# Patient Record
Sex: Male | Born: 1955 | Race: White | Hispanic: No | Smoking: Current every day smoker
Health system: Southern US, Community
[De-identification: ages and names within clinical notes are randomized; demographics above are authoritative.]

## PROBLEM LIST (undated history)

## (undated) DIAGNOSIS — E8881 Metabolic syndrome: Secondary | ICD-10-CM

## (undated) DIAGNOSIS — K219 Gastro-esophageal reflux disease without esophagitis: Secondary | ICD-10-CM

## (undated) DIAGNOSIS — F419 Anxiety disorder, unspecified: Secondary | ICD-10-CM

## (undated) DIAGNOSIS — F32A Depression, unspecified: Secondary | ICD-10-CM

## (undated) DIAGNOSIS — E119 Type 2 diabetes mellitus without complications: Secondary | ICD-10-CM

## (undated) DIAGNOSIS — F329 Major depressive disorder, single episode, unspecified: Secondary | ICD-10-CM

## (undated) DIAGNOSIS — I10 Essential (primary) hypertension: Secondary | ICD-10-CM

## (undated) HISTORY — DX: Depression, unspecified: F32.A

## (undated) HISTORY — DX: Type 2 diabetes mellitus without complications: E11.9

## (undated) HISTORY — DX: Essential (primary) hypertension: I10

## (undated) HISTORY — PX: TYMPANOPLASTY: SHX33

## (undated) HISTORY — DX: Anxiety disorder, unspecified: F41.9

## (undated) HISTORY — PX: TONSILLECTOMY: SUR1361

## (undated) HISTORY — DX: Gastro-esophageal reflux disease without esophagitis: K21.9

## (undated) HISTORY — DX: Major depressive disorder, single episode, unspecified: F32.9

## (undated) HISTORY — DX: Metabolic syndrome: E88.81

---

## 2005-01-29 DIAGNOSIS — E119 Type 2 diabetes mellitus without complications: Secondary | ICD-10-CM

## 2005-01-29 DIAGNOSIS — I10 Essential (primary) hypertension: Secondary | ICD-10-CM

## 2005-01-29 HISTORY — DX: Essential (primary) hypertension: I10

## 2005-01-29 HISTORY — DX: Type 2 diabetes mellitus without complications: E11.9

## 2015-11-29 DIAGNOSIS — Z139 Encounter for screening, unspecified: Secondary | ICD-10-CM

## 2015-11-29 NOTE — Congregational Nurse Program (Signed)
Congregational Nurse Program Note  Date of Encounter: 11/29/2015  Past Medical History: No past medical history on file.  Encounter Details:     CNP Questionnaire - 11/29/15 1445      Patient Demographics   Is this a new or existing patient? New   Patient is considered a/an Not Applicable   Race African-American/Black     Patient Assistance   Location of Patient [place]   Patient's financial/insurance status Low Income;Self-Pay   Uninsured Patient Yes   Interventions Counseled to make appt. with provider;Assisted patient in making appt.;Referred to ED/Urgent Care   Patient referred to apply for the following financial assistance Not Applicable   Food insecurities addressed Not Applicable   Transportation assistance No   Assistance securing medications No   Educational health offerings Chronic disease;Diabetes;Navigating the healthcare system;Nutrition     Encounter Details   Primary purpose of visit Chronic Illness/Condition Visit;Navigating the Healthcare System;Education/Health Concerns   Was an Emergency Department visit averted? No  referred client to Emergency room at [facility], client declined   Does patient have a medical provider? No   Patient referred to Area Agency;Clinic;Establish PCP;Emergency Department   Was a mental health screening completed? (GAINS tool) No   Does patient have dental issues? No   Does patient have vision issues? No   Does your patient have an abnormal blood pressure today? Yes   Since previous encounter, have you referred patient for abnormal blood pressure that resulted in a new diagnosis or medication change? No   Does your patient have an abnormal blood glucose today? Yes   Since previous encounter, have you referred patient for abnormal blood glucose that resulted in a new diagnosis or medication change? No   Was there a life-saving intervention made? No     New client to [employer]. Client currently living with  mother who is disabled and is primary caregiver.  Client states it has been years since he has seen a primary care provider and years since he has taken any medication for his diabetes. Client has no income and no health insurance. Vitals: 154/94 normal cuff, sitting, pulse 120( client states he is very nervous), non-fasting random fingerstick glucose 418 client ate oatmeal and coffee at 1:00 PM today. He states he too Metformin years ago 1000 mg twice daily.  Past Surgical history: Ear drum replacement at age 7 .  Medical History: Diabetes type 2, Hypertension, Depression?.  Allergies: Morphine= Nausea Complains today of needing a referral to a medical provider for management of his diabetes , hypertension and general medical care. He also is complaining of some abdominal pain and constipation. He states he eats fresh fruits and vegetables and drinks plenty of water. He states he has tried stool softeners , but they have not helped a lot. Bowel sounds present, abdomen soft. Client states a question to past history of possible IBS. He is also complaining of pain in both knees, along with acid reflux.  RN counseled client on the risks of his uncontrolled blood sugars and with the result of his capillary glucose today of 418, Danger of damage to his eyes, kidneys , heart and circulation , along with risk of stroke and heart attack. Recommended that client go to the Emergency room today for treatment and management of his blood sugar. Client states understanding of the risks of uncontrolled blood sugars and declines going to the emergency room today, due to having no one to care for his mother or  to check in on her. He states he still wants a primary care referral. Options for medical care discussed along with mental health care for depression and stress. Client chooses to be referred to the [facility] and appointment secured for 12/01/15 at 1:15 pm. Client wishes to wait for referral for  mental health services. He denies thoughts or plans of suicide or of harming others. He states no past suicidal attempts.  RN will follow up with client [facility] . Discussed with client that we currently have social work interns available on Tuesdays and Thursday that can further assess his needs and assist in mental health referrals. Client agreed to think about it and we will discuss it further on the follow up call from the RN.  RN again recommended that client go to the Emergency Room at [facility] today for treatment of his elevated blood sugar. Client states he will not go today due to need to care for his mother who is disabled. RN encouraged client to call 911 immediately for any changes in vision, difficulty with speech, facial drooping or weakness in arms or legs , any increase or change in abdominal discomfort or any other changes . Client understands and states that he will do so. Will follow up with client post PCP appointment.

## 2015-12-01 ENCOUNTER — Encounter: Payer: Self-pay | Admitting: Physician Assistant

## 2015-12-01 ENCOUNTER — Ambulatory Visit: Payer: Self-pay | Admitting: Physician Assistant

## 2015-12-01 ENCOUNTER — Telehealth: Payer: Self-pay

## 2015-12-01 VITALS — BP 146/76 | HR 111 | Temp 98.1°F | Ht 70.75 in | Wt 191.0 lb

## 2015-12-01 DIAGNOSIS — I10 Essential (primary) hypertension: Secondary | ICD-10-CM

## 2015-12-01 DIAGNOSIS — IMO0001 Reserved for inherently not codable concepts without codable children: Secondary | ICD-10-CM

## 2015-12-01 DIAGNOSIS — R062 Wheezing: Secondary | ICD-10-CM

## 2015-12-01 DIAGNOSIS — F419 Anxiety disorder, unspecified: Secondary | ICD-10-CM

## 2015-12-01 DIAGNOSIS — F17218 Nicotine dependence, cigarettes, with other nicotine-induced disorders: Secondary | ICD-10-CM

## 2015-12-01 DIAGNOSIS — E785 Hyperlipidemia, unspecified: Secondary | ICD-10-CM

## 2015-12-01 DIAGNOSIS — E1165 Type 2 diabetes mellitus with hyperglycemia: Principal | ICD-10-CM

## 2015-12-01 DIAGNOSIS — Z125 Encounter for screening for malignant neoplasm of prostate: Secondary | ICD-10-CM

## 2015-12-01 DIAGNOSIS — Z1211 Encounter for screening for malignant neoplasm of colon: Secondary | ICD-10-CM

## 2015-12-01 DIAGNOSIS — E118 Type 2 diabetes mellitus with unspecified complications: Secondary | ICD-10-CM

## 2015-12-01 LAB — GLUCOSE, POCT (MANUAL RESULT ENTRY): POC GLUCOSE: 331 mg/dL — AB (ref 70–99)

## 2015-12-01 MED ORDER — LISINOPRIL 10 MG PO TABS: 10.0000 mg | ORAL_TABLET | Freq: Every day | ORAL | 1 refills | Status: DC

## 2015-12-01 MED ORDER — METFORMIN HCL 1000 MG PO TABS: 1000.0000 mg | ORAL_TABLET | Freq: Two times a day (BID) | ORAL | 1 refills | Status: DC

## 2015-12-01 NOTE — Patient Instructions (Signed)
Get lab/blood draw Return stool test Go to Diabetes education class Go to [facility] for depression/anxiety

## 2015-12-01 NOTE — Telephone Encounter (Signed)
Client called RN to follow up after his first new patient visit to the [facility]. Client states that his appointmehnt went well and that he feels much better and less stressed. He states they will get him lab work and back on medication for his diabetes and others as needed. RN discussed with client about connecting him to other services such as mental health services for depression. Client states that he is "okay" now that he is in to see a provider. RN encouraged client to call if we can assist with any other needs. Will follow as needed.

## 2015-12-01 NOTE — Progress Notes (Signed)
BP (!) 146/76 (BP Location: Left Arm, Patient Position: Sitting, Cuff Size: Normal)   Pulse (!) 111   Temp 98.1 F (36.7 C)   Ht 5' 10.75" (1.797 m)   Wt 191 lb (86.6 kg)   SpO2 98%   BMI 26.83 kg/m    Subjective:    Patient ID: Jerry Cuevas, male    DOB: [birth year], 60 y.o.   MRN: [id number]  HPI: Jerry Cuevas is a 60 y.o. male presenting on 12/01/2015 for New Patient (Initial Visit) (pt states he hasn't had a provider in about 5 years. pt states provider was in [place]. pt moved to [facility] about 3 months ago to care for his mother)   HPI  Chief Complaint  Patient presents with  . New Patient (Initial Visit)    pt states he hasn't had a provider in about 5 years. pt states provider was in [place]. pt moved to [facility] about 3 months ago to care for his mother     In the past, pt was on metformin, glymiperide and amitrypylline and lisinopril and hctz  Pt already given in formation on walk-in for [facility]. He hasn't been yet    Relevant past medical, surgical, family and social history reviewed and updated as indicated. Interim medical history since our last visit reviewed. Allergies and medications reviewed and updated.  CURRENT MEDS: none  Review of Systems  Constitutional: Positive for fatigue. Negative for appetite change, chills, diaphoresis, fever and unexpected weight change.  HENT: Positive for dental problem. Negative for congestion, drooling, ear pain, facial swelling, hearing loss, mouth sores, sneezing, sore throat, trouble swallowing and voice change.   Eyes: Negative for pain, discharge, redness, itching and visual disturbance.  Respiratory: Negative for cough, choking, shortness of breath and wheezing.   Cardiovascular: Negative for chest pain, palpitations and leg swelling.  Gastrointestinal: Positive for constipation. Negative for abdominal pain, blood in stool, diarrhea and vomiting.  Endocrine: Negative for cold intolerance,  heat intolerance and polydipsia.  Genitourinary: Negative for decreased urine volume, dysuria and hematuria.  Musculoskeletal: Positive for arthralgias and back pain. Negative for gait problem.  Skin: Negative for rash.  Allergic/Immunologic: Negative for environmental allergies.  Neurological: Negative for seizures, syncope, light-headedness and headaches.  Hematological: Negative for adenopathy.  Psychiatric/Behavioral: Positive for dysphoric mood. Negative for agitation and suicidal ideas. The patient is not nervous/anxious.     Per HPI unless specifically indicated above     Objective:    BP (!) 146/76 (BP Location: Left Arm, Patient Position: Sitting, Cuff Size: Normal)   Pulse (!) 111   Temp 98.1 F (36.7 C)   Ht 5' 10.75" (1.797 m)   Wt 191 lb (86.6 kg)   SpO2 98%   BMI 26.83 kg/m   Wt Readings from Last 3 Encounters:  12/01/15 191 lb (86.6 kg)  11/29/15 192 lb 6.4 oz (87.3 kg)    Physical Exam  Constitutional: He is oriented to person, place, and time. He appears well-developed and well-nourished.  HENT:  Head: Normocephalic and atraumatic.  Mouth/Throat: Oropharynx is clear and moist. No oropharyngeal exudate.  Eyes: Conjunctivae and EOM are normal. Pupils are equal, round, and reactive to light.  Neck: Neck supple. No thyromegaly present.  Cardiovascular: Normal rate and regular rhythm.   Pulmonary/Chest: Effort normal and breath sounds normal. He has no wheezes. He has no rales.  Abdominal: Soft. Bowel sounds are normal. He exhibits no mass. There is no hepatosplenomegaly. There is no tenderness.  Musculoskeletal:  He exhibits no edema.  Lymphadenopathy:    He has no cervical adenopathy.  Neurological: He is alert and oriented to person, place, and time.  Skin: Skin is warm and dry. No rash noted.  Psychiatric: He has a normal mood and affect. His behavior is normal. Thought content normal.  Vitals reviewed.   Results for orders placed or performed in visit on  12/01/15  POCT Glucose (CBG)  Result Value Ref Range   POC Glucose 331 (A) 70 - 99 mg/dl      Assessment & Plan:    Encounter Diagnoses  Name Primary?  Marland Kitchen Uncontrolled diabetes mellitus type 2 without complications, unspecified long term insulin use status ([place]) Yes  . Essential hypertension   . Hyperlipidemia, unspecified hyperlipidemia type   . Anxiety   . Cigarette nicotine dependence with other nicotine-induced disorder   . Wheezing   . Type 2 diabetes mellitus with complication, unspecified long term insulin use status ([place])   . Screening for prostate cancer   . Special screening for malignant neoplasms, colon     -Gave pt another card and told him about walk-in at [facility] -get baseline Labs -pt given ifobt for colon cancer screening -pt to attend Dm education class -refer pt for Diabetic eye exam -rx lisinopril and metformin.  Pt aware that meds likely will need to be adjusted after getting lab results -F/u 1 mo

## 2015-12-03 ENCOUNTER — Other Ambulatory Visit: Payer: Self-pay | Admitting: Physician Assistant

## 2015-12-03 DIAGNOSIS — E1165 Type 2 diabetes mellitus with hyperglycemia: Secondary | ICD-10-CM

## 2015-12-03 DIAGNOSIS — I10 Essential (primary) hypertension: Secondary | ICD-10-CM | POA: Insufficient documentation

## 2015-12-03 DIAGNOSIS — IMO0001 Reserved for inherently not codable concepts without codable children: Secondary | ICD-10-CM | POA: Insufficient documentation

## 2015-12-03 DIAGNOSIS — F419 Anxiety disorder, unspecified: Secondary | ICD-10-CM | POA: Insufficient documentation

## 2015-12-03 DIAGNOSIS — F17200 Nicotine dependence, unspecified, uncomplicated: Secondary | ICD-10-CM | POA: Insufficient documentation

## 2015-12-03 DIAGNOSIS — Z1211 Encounter for screening for malignant neoplasm of colon: Secondary | ICD-10-CM

## 2015-12-03 DIAGNOSIS — E785 Hyperlipidemia, unspecified: Secondary | ICD-10-CM | POA: Insufficient documentation

## 2015-12-06 LAB — CBC WITH DIFFERENTIAL/PLATELET
BASOS ABS: 101 {cells}/uL (ref 0–200)
BASOS PCT: 1 %
EOS PCT: 6 %
Eosinophils Absolute: 606 cells/uL — ABNORMAL HIGH (ref 15–500)
HCT: 40.6 % (ref 38.5–50.0)
Hemoglobin: 13.7 g/dL (ref 13.2–17.1)
Lymphocytes Relative: 26 %
Lymphs Abs: 2626 cells/uL (ref 850–3900)
MCH: 30.2 pg (ref 27.0–33.0)
MCHC: 33.7 g/dL (ref 32.0–36.0)
MCV: 89.4 fL (ref 80.0–100.0)
MONOS PCT: 7 %
MPV: 9.7 fL (ref 7.5–12.5)
Monocytes Absolute: 707 cells/uL (ref 200–950)
NEUTROS ABS: 6060 {cells}/uL (ref 1500–7800)
Neutrophils Relative %: 60 %
PLATELETS: 246 10*3/uL (ref 140–400)
RBC: 4.54 MIL/uL (ref 4.20–5.80)
RDW: 13.5 % (ref 11.0–15.0)
WBC: 10.1 10*3/uL (ref 3.8–10.8)

## 2015-12-07 LAB — HEMOGLOBIN A1C
Hgb A1c MFr Bld: 13.6 % — ABNORMAL HIGH (ref <5.7)
Mean Plasma Glucose: 344 mg/dL

## 2015-12-07 LAB — COMPREHENSIVE METABOLIC PANEL
ALBUMIN: 3.9 g/dL (ref 3.6–5.1)
ALK PHOS: 109 U/L (ref 40–115)
ALT: 14 U/L (ref 9–46)
AST: 16 U/L (ref 10–35)
BILIRUBIN TOTAL: 0.6 mg/dL (ref 0.2–1.2)
BUN: 11 mg/dL (ref 7–25)
CALCIUM: 9.4 mg/dL (ref 8.6–10.3)
CO2: 25 mmol/L (ref 20–31)
Chloride: 99 mmol/L (ref 98–110)
Creat: 0.77 mg/dL (ref 0.70–1.25)
Glucose, Bld: 326 mg/dL — ABNORMAL HIGH (ref 65–99)
Potassium: 4.5 mmol/L (ref 3.5–5.3)
Sodium: 131 mmol/L — ABNORMAL LOW (ref 135–146)
TOTAL PROTEIN: 6.4 g/dL (ref 6.1–8.1)

## 2015-12-07 LAB — LIPID PANEL
Cholesterol: 241 mg/dL — ABNORMAL HIGH (ref <200)
HDL: 38 mg/dL — AB (ref >40)
LDL Cholesterol: 134 mg/dL — ABNORMAL HIGH
Total CHOL/HDL Ratio: 6.3 Ratio — ABNORMAL HIGH (ref <5.0)
Triglycerides: 343 mg/dL — ABNORMAL HIGH (ref <150)
VLDL: 69 mg/dL — ABNORMAL HIGH (ref <30)

## 2015-12-07 LAB — TSH: TSH: 2.44 mIU/L (ref 0.40–4.50)

## 2015-12-07 LAB — MICROALBUMIN, URINE: MICROALB UR: 0.6 mg/dL

## 2015-12-07 LAB — PSA: PSA: 0.8 ng/mL (ref <=4.0)

## 2016-01-03 ENCOUNTER — Encounter: Payer: Self-pay | Admitting: Physician Assistant

## 2016-01-03 ENCOUNTER — Ambulatory Visit: Payer: Self-pay | Admitting: Physician Assistant

## 2016-01-03 VITALS — BP 146/72 | HR 103 | Temp 97.7°F | Ht 70.75 in | Wt 191.3 lb

## 2016-01-03 DIAGNOSIS — IMO0001 Reserved for inherently not codable concepts without codable children: Secondary | ICD-10-CM

## 2016-01-03 DIAGNOSIS — F17218 Nicotine dependence, cigarettes, with other nicotine-induced disorders: Secondary | ICD-10-CM

## 2016-01-03 DIAGNOSIS — K5900 Constipation, unspecified: Secondary | ICD-10-CM

## 2016-01-03 DIAGNOSIS — E785 Hyperlipidemia, unspecified: Secondary | ICD-10-CM

## 2016-01-03 DIAGNOSIS — E1165 Type 2 diabetes mellitus with hyperglycemia: Principal | ICD-10-CM

## 2016-01-03 DIAGNOSIS — I10 Essential (primary) hypertension: Secondary | ICD-10-CM

## 2016-01-03 LAB — IFOBT (OCCULT BLOOD): IMMUNOLOGICAL FECAL OCCULT BLOOD TEST: NEGATIVE

## 2016-01-03 MED ORDER — METFORMIN HCL 1000 MG PO TABS: 1000.0000 mg | ORAL_TABLET | Freq: Two times a day (BID) | ORAL | 1 refills | Status: DC

## 2016-01-03 MED ORDER — SITAGLIPTIN PHOSPHATE 100 MG PO TABS
100.0000 mg | ORAL_TABLET | Freq: Every day | ORAL | 1 refills | Status: AC
Start: 2016-01-03 — End: ?

## 2016-01-03 MED ORDER — ATORVASTATIN CALCIUM 10 MG PO TABS: 10.0000 mg | ORAL_TABLET | Freq: Every day | ORAL | 1 refills | Status: AC

## 2016-01-03 MED ORDER — LISINOPRIL 20 MG PO TABS: 20.0000 mg | ORAL_TABLET | Freq: Every day | ORAL | 2 refills | Status: AC

## 2016-01-03 NOTE — Progress Notes (Signed)
BP (!) 146/72 (BP Location: Left Arm, Patient Position: Sitting, Cuff Size: Normal)   Pulse (!) 103   Temp 97.7 F (36.5 C) (Other (Comment))   Ht 5' 10.75" (1.797 m)   Wt 191 lb 4.8 oz (86.8 kg)   SpO2 97%   BMI 26.87 kg/m    Subjective:    Patient ID: Jerry Cuevas, male    DOB: [birth year], 60 y.o.   MRN: [id number]  HPI: Jerry Cuevas is a 60 y.o. male presenting on 01/03/2016 for Diabetes and Hypertension   HPI   Pt c/o abd pain. He thinks it may be r/t constipation, is using Metamucil.  He says he feels full "all the time".  Burps a lot.  Going on a couple months.   Relevant past medical, surgical, family and social history reviewed and updated as indicated. Interim medical history since our last visit reviewed. Allergies and medications reviewed and updated.   Current Outpatient Prescriptions:  .  lisinopril (PRINIVIL,ZESTRIL) 10 MG tablet, Take 1 tablet (10 mg total) by mouth daily., Disp: 30 tablet, Rfl: 1 .  metFORMIN (GLUCOPHAGE) 1000 MG tablet, Take 1 tablet (1,000 mg total) by mouth 2 (two) times daily with a meal., Disp: 60 tablet, Rfl: 1   Review of Systems  Constitutional: Positive for appetite change and unexpected weight change. Negative for chills, diaphoresis, fatigue and fever.  HENT: Positive for dental problem. Negative for congestion, drooling, ear pain, facial swelling, hearing loss, mouth sores, sneezing, sore throat, trouble swallowing and voice change.   Eyes: Positive for redness. Negative for pain, discharge, itching and visual disturbance.  Respiratory: Positive for wheezing. Negative for cough, choking and shortness of breath.   Cardiovascular: Negative for chest pain, palpitations and leg swelling.  Gastrointestinal: Positive for abdominal pain and constipation. Negative for blood in stool, diarrhea and vomiting.  Endocrine: Positive for cold intolerance. Negative for heat intolerance and polydipsia.  Genitourinary: Negative for decreased urine  volume, dysuria and hematuria.  Musculoskeletal: Positive for back pain. Negative for arthralgias and gait problem.  Skin: Negative for rash.  Allergic/Immunologic: Negative for environmental allergies.  Neurological: Negative for seizures, syncope, light-headedness and headaches.  Hematological: Negative for adenopathy.  Psychiatric/Behavioral: Positive for dysphoric mood. Negative for agitation and suicidal ideas. The patient is nervous/anxious.     Per HPI unless specifically indicated above     Objective:    BP (!) 146/72 (BP Location: Left Arm, Patient Position: Sitting, Cuff Size: Normal)   Pulse (!) 103   Temp 97.7 F (36.5 C) (Other (Comment))   Ht 5' 10.75" (1.797 m)   Wt 191 lb 4.8 oz (86.8 kg)   SpO2 97%   BMI 26.87 kg/m   Wt Readings from Last 3 Encounters:  01/03/16 191 lb 4.8 oz (86.8 kg)  12/01/15 191 lb (86.6 kg)  11/29/15 192 lb 6.4 oz (87.3 kg)    Physical Exam  Constitutional: He is oriented to person, place, and time. He appears well-developed and well-nourished.  HENT:  Head: Normocephalic and atraumatic.  Neck: Neck supple.  Cardiovascular: Normal rate and regular rhythm.   Pulmonary/Chest: Effort normal and breath sounds normal. He has no wheezes.  Abdominal: Soft. Bowel sounds are normal. He exhibits no distension and no mass. There is no hepatosplenomegaly. There is no tenderness. There is no rigidity, no rebound, no guarding and no CVA tenderness.  Musculoskeletal: He exhibits no edema.  Lymphadenopathy:    He has no cervical adenopathy.  Neurological: He is alert and oriented to  person, place, and time.  Skin: Skin is warm and dry.  Psychiatric: He has a normal mood and affect. His behavior is normal.  Vitals reviewed.      Results for orders placed or performed in visit on 12/01/15  HgB A1c  Result Value Ref Range   Hgb A1c MFr Bld 13.6 (H) <5.7 %   Mean Plasma Glucose 344 mg/dL  Comprehensive Metabolic Panel (CMET)  Result Value Ref  Range   Sodium 131 (L) 135 - 146 mmol/L   Potassium 4.5 3.5 - 5.3 mmol/L   Chloride 99 98 - 110 mmol/L   CO2 25 20 - 31 mmol/L   Glucose, Bld 326 (H) 65 - 99 mg/dL   BUN 11 7 - 25 mg/dL   Creat 0.77 0.70 - 1.25 mg/dL   Total Bilirubin 0.6 0.2 - 1.2 mg/dL   Alkaline Phosphatase 109 40 - 115 U/L   AST 16 10 - 35 U/L   ALT 14 9 - 46 U/L   Total Protein 6.4 6.1 - 8.1 g/dL   Albumin 3.9 3.6 - 5.1 g/dL   Calcium 9.4 8.6 - 10.3 mg/dL  CBC w/Diff/Platelet  Result Value Ref Range   WBC 10.1 3.8 - 10.8 K/uL   RBC 4.54 4.20 - 5.80 MIL/uL   Hemoglobin 13.7 13.2 - 17.1 g/dL   HCT 40.6 38.5 - 50.0 %   MCV 89.4 80.0 - 100.0 fL   MCH 30.2 27.0 - 33.0 pg   MCHC 33.7 32.0 - 36.0 g/dL   RDW 13.5 11.0 - 15.0 %   Platelets 246 140 - 400 K/uL   MPV 9.7 7.5 - 12.5 fL   Neutro Abs 6,060 1,500 - 7,800 cells/uL   Lymphs Abs 2,626 850 - 3,900 cells/uL   Monocytes Absolute 707 200 - 950 cells/uL   Eosinophils Absolute 606 (H) 15 - 500 cells/uL   Basophils Absolute 101 0 - 200 cells/uL   Neutrophils Relative % 60 %   Lymphocytes Relative 26 %   Monocytes Relative 7 %   Eosinophils Relative 6 %   Basophils Relative 1 %   Smear Review Criteria for review not met   Microalbumin, urine  Result Value Ref Range   Microalb, Ur 0.6 Not estab mg/dL  Lipid Profile  Result Value Ref Range   Cholesterol 241 (H) <200 mg/dL   Triglycerides 343 (H) <150 mg/dL   HDL 38 (L) >40 mg/dL   Total CHOL/HDL Ratio 6.3 (H) <5.0 Ratio   VLDL 69 (H) <30 mg/dL   LDL Cholesterol 134 (H) mg/dL  PSA  Result Value Ref Range   PSA 0.8 <=4.0 ng/mL  TSH  Result Value Ref Range   TSH 2.44 0.40 - 4.50 mIU/L  POCT Glucose (CBG)  Result Value Ref Range   POC Glucose 331 (A) 70 - 99 mg/dl      Assessment & Plan:   Encounter Diagnoses  Name Primary?  Marland Kitchen Uncontrolled diabetes mellitus type 2 without complications, unspecified long term insulin use status ([place]) Yes  . Essential hypertension   . Hyperlipidemia, unspecified  hyperlipidemia type   . Constipation, unspecified constipation type   . Cigarette nicotine dependence with other nicotine-induced disorder     -reviewed labs with pt  -pt "doesn't believe in statins".  Discussed and he agrees to try statins. Will rx atorvastatin.  And low fat diet -Increase lisinopril  To 88m qd -add januvia -get pt Signed up for medassist -Counseled smoking cessation -sign up pt for Dm eye  exam -counseled on constipation and encouraged increase in fluids and exercise. Also improvements in high fiber diet.  Gave handout -follow up 6 weeks to recheck blood pressure and tolerance to new meds.  RTO sooner prn

## 2016-01-03 NOTE — Patient Instructions (Addendum)
Constipation, Adult Constipation is when a person has fewer bowel movements in a week than normal, has difficulty having a bowel movement, or has stools that are dry, hard, or larger than normal. Constipation may be caused by an underlying condition. It may become worse with age if a person takes certain medicines and does not take in enough fluids. Follow these instructions at home: Eating and drinking  Eat foods that have a lot of fiber, such as fresh fruits and vegetables, whole grains, and beans.  Limit foods that are high in fat, low in fiber, or overly processed, such as french fries, hamburgers, cookies, candies, and soda.  Drink enough fluid to keep your urine clear or pale yellow. General instructions  Exercise regularly or as told by your health care provider.  Go to the restroom when you have the urge to go. Do not hold it in.  Take over-the-counter and prescription medicines only as told by your health care provider. These include any fiber supplements.  Practice pelvic floor retraining exercises, such as deep breathing while relaxing the lower abdomen and pelvic floor relaxation during bowel movements.  Watch your condition for any changes.  Keep all follow-up visits as told by your health care provider. This is important. Contact a health care provider if:  You have pain that gets worse.  You have a fever.  You do not have a bowel movement after 4 days.  You vomit.  You are not hungry.  You lose weight.  You are bleeding from the anus.  You have thin, pencil-like stools. Get help right away if:  You have a fever and your symptoms suddenly get worse.  You leak stool or have blood in your stool.  Your abdomen is bloated.  You have severe pain in your abdomen.  You feel dizzy or you faint. This information is not intended to replace advice given to you by your health care provider. Make sure you discuss any questions you have with your health care  provider. Document Released: 10/14/2003 Document Revised: 08/05/2015 Document Reviewed: 07/06/2015 Elsevier Interactive Patient Education  2017 Else[employer] Inc.   Fat and Cholesterol Restricted Diet High levels of fat and cholesterol in your blood may lead to various health problems, such as diseases of the heart, blood vessels, gallbladder, liver, and pancreas. Fats are concentrated sources of energy that come in various forms. Certain types of fat, including saturated fat, may be harmful in excess. Cholesterol is a substance needed by your body in small amounts. Your body makes all the cholesterol it needs. Excess cholesterol comes from the food you eat. When you have high levels of cholesterol and saturated fat in your blood, health problems can develop because the excess fat and cholesterol will gather along the walls of your blood vessels, causing them to narrow. Choosing the right foods will help you control your intake of fat and cholesterol. This will help keep the levels of these substances in your blood within normal limits and reduce your risk of disease. What is my plan? Your health care provider recommends that you:  Limit your fat intake to ______% or less of your total calories per day.  Limit the amount of cholesterol in your diet to less than _________mg per day.  Eat 20-30 grams of fiber each day. What types of fat should I choose?  Choose healthy fats more often. Choose monounsaturated and polyunsaturated fats, such as olive and canola oil, flaxseeds, walnuts, almonds, and seeds.  Eat more  omega-3 fats. Good choices include salmon, mackerel, sardines, tuna, flaxseed oil, and ground flaxseeds. Aim to eat fish at least two times a week.  Limit saturated fats. Saturated fats are primarily found in animal products, such as meats, butter, and cream. Plant sources of saturated fats include palm oil, palm kernel oil, and coconut oil.  Avoid foods with partially hydrogenated oils in  them. These contain trans fats. Examples of foods that contain trans fats are stick margarine, some tub margarines, cookies, crackers, and other baked goods. What general guidelines do I need to follow? These guidelines for healthy eating will help you control your intake of fat and cholesterol:  Check food labels carefully to identify foods with trans fats or high amounts of saturated fat.  Fill one half of your plate with vegetables and green salads.  Fill one fourth of your plate with whole grains. Look for the word "whole" as the first word in the ingredient list.  Fill one fourth of your plate with lean protein foods.  Limit fruit to two servings a day. Choose fruit instead of juice.  Eat more foods that contain fiber, such as apples, broccoli, carrots, beans, peas, and barley.  Eat more home-cooked food and less restaurant, buffet, and fast food.  Limit or avoid alcohol.  Limit foods high in starch and sugar.  Limit fried foods.  Cook foods using methods other than frying. Baking, boiling, grilling, and broiling are all great options.  Lose weight if you are overweight. Losing just 5-10% of your initial body weight can help your overall health and prevent diseases such as diabetes and heart disease. What foods can I eat? Grains  Whole grains, such as whole wheat or whole grain breads, crackers, cereals, and pasta. Unsweetened oatmeal, bulgur, barley, quinoa, or brown rice. Corn or whole wheat flour tortillas. Vegetables  Fresh or frozen vegetables (raw, steamed, roasted, or grilled). Green salads. Fruits  All fresh, canned (in natural juice), or frozen fruits. Meats and other protein foods  Ground beef (85% or leaner), grass-fed beef, or beef trimmed of fat. Skinless chicken or [place]. Ground chicken or [place]. Pork trimmed of fat. All fish and seafood. Eggs. Dried beans, peas, or lentils. Unsalted nuts or seeds. Unsalted canned or dry beans. Dairy  Low-fat dairy  products, such as skim or 1% milk, 2% or reduced-fat cheeses, low-fat ricotta or cottage cheese, or plain low-fat yo Fats and oils  Tub margarines without trans fats. Light or reduced-fat mayonnaise and salad dressings. Avocado. Olive, canola, sesame, or safflower oils. Natural peanut or almond butter (choose ones without added sugar and oil). The items listed above may not be a complete list of recommended foods or beverages. Contact your dietitian for more options.  Foods to avoid Grains  White bread. White pasta. White rice. Cornbread. Bagels, pastries, and croissants. Crackers that contain trans fat. Vegetables  White potatoes. Corn. Creamed or fried vegetables. Vegetables in a cheese sauce. Fruits  Dried fruits. Canned fruit in light or heavy syrup. Fruit juice. Meats and other protein foods  Fatty cuts of meat. Ribs, chicken wings, bacon, sausage, bologna, salami, chitterlings, fatback, hot dogs, bratwurst, and packaged luncheon meats. Liver and organ meats. Dairy  Whole or 2% milk, cream, half-and-half, and cream cheese. Whole milk cheeses. Whole-fat or sweetened yogurt. Full-fat cheeses. Nondairy creamers and whipped toppings. Processed cheese, cheese spreads, or cheese curds. Beverages  Alcohol. Sweetened drinks (such as sodas, lemonade, and fruit drinks or punches). Fats and oils  Butter, stick margarine,  lard, shortening, ghee, or bacon fat. Coconut, palm kernel, or palm oils. Sweets and desserts  Corn syrup, sugars, honey, and molasses. Candy. Jam and jelly. Syrup. Sweetened cereals. Cookies, pies, cakes, donuts, muffins, and ice cream. The items listed above may not be a complete list of foods and beverages to avoid. Contact your dietitian for more information.  This information is not intended to replace advice given to you by your health care provider. Make sure you discuss any questions you have with your health care provider. Document Released: 01/15/2005 Document  Revised: 02/05/2014 Document Reviewed: 04/15/2013 Elsevier Interactive Patient Education  2017 [employer].    Steps to Quit Smoking Smoking tobacco can be harmful to your health and can affect almost every organ in your body. Smoking puts you, and those around you, at risk for developing many serious chronic diseases. Quitting smoking is difficult, but it is one of the best things that you can do for your health. It is never too late to quit. What are the benefits of quitting smoking? When you quit smoking, you lower your risk of developing serious diseases and conditions, such as:  Lung cancer or lung disease, such as COPD.  Heart disease.  Stroke.  Heart attack.  Infertility.  Osteoporosis and bone fractures. Additionally, symptoms such as coughing, wheezing, and shortness of breath may get better when you quit. You may also find that you get sick less often because your body is stronger at fighting off colds and infections. If you are pregnant, quitting smoking can help to reduce your chances of having a baby of low birth weight. How do I get ready to quit? When you decide to quit smoking, create a plan to make sure that you are successful. Before you quit:  Pick a date to quit. Set a date within the next two weeks to give you time to prepare.  Write down the reasons why you are quitting. Keep this list in places where you will see it often, such as on your bathroom mirror or in your car or wallet.  Identify the people, places, things, and activities that make you want to smoke (triggers) and avoid them. Make sure to take these actions:  Throw away all cigarettes at home, at work, and in your car.  Throw away smoking accessories, such as Scientist, research (medical).  Clean your car and make sure to empty the ashtray.  Clean your home, including curtains and carpets.  Tell your family, friends, and coworkers that you are quitting. Support from your loved ones can make quitting  easier.  Talk with your health care provider about your options for quitting smoking.  Find out what treatment options are covered by your health insurance. What strategies can I use to quit smoking? Talk with your healthcare provider about different strategies to quit smoking. Some strategies include:  Quitting smoking altogether instead of gradually lessening how much you smoke over a period of time. Research shows that quitting "cold [place]" is more successful than gradually quitting.  Attending in-person counseling to help you build problem-solving skills. You are more likely to have success in quitting if you attend several counseling sessions. Even short sessions of 10 minutes can be effective.  Finding resources and support systems that can help you to quit smoking and remain smoke-free after you quit. These resources are most helpful when you use them often. They can include:  Online chats with a Social worker.  Telephone quitlines.  Printed Furniture conservator/restorer.  Support groups or  group counseling.  Text messaging programs.  Mobile phone applications.  Taking medicines to help you quit smoking. (If you are pregnant or breastfeeding, talk with your health care provider first.) Some medicines contain nicotine and some do not. Both types of medicines help with cravings, but the medicines that include nicotine help to relieve withdrawal symptoms. Your health care provider may recommend:  Nicotine patches, gum, or lozenges.  Nicotine inhalers or sprays.  Non-nicotine medicine that is taken by mouth. Talk with your health care provider about combining strategies, such as taking medicines while you are also receiving in-person counseling. Using these two strategies together makes you more likely to succeed in quitting than if you used either strategy on its own. If you are pregnant or breastfeeding, talk with your health care provider about finding counseling or other support  strategies to quit smoking. Do not take medicine to help you quit smoking unless told to do so by your health care provider. What things can I do to make it easier to quit? Quitting smoking might feel overwhelming at first, but there is a lot that you can do to make it easier. Take these important actions:  Reach out to your family and friends and ask that they support and encourage you during this time. Call telephone quitlines, reach out to support groups, or work with a counselor for support.  Ask people who smoke to avoid smoking around you.  Avoid places that trigger you to smoke, such as bars, parties, or smoke-break areas at work.  Spend time around people who do not smoke.  Lessen stress in your life, because stress can be a smoking trigger for some people. To lessen stress, try:  Exercising regularly.  Deep-breathing exercises.  Yoga.  Meditating.  Performing a body scan. This involves closing your eyes, scanning your body from head to toe, and noticing which parts of your body are particularly tense. Purposefully relax the muscles in those areas.  Download or purchase mobile phone or tablet apps (applications) that can help you stick to your quit plan by providing reminders, tips, and encouragement. There are many free apps, such as QuitGuide from the [employer] Office manager for Disease Control and Prevention). You can find other support for quitting smoking (smoking cessation) through smokefree.gov and other websites. How will I feel when I quit smoking? Within the first 24 hours of quitting smoking, you may start to feel some withdrawal symptoms. These symptoms are usually most noticeable 2-3 days after quitting, but they usually do not last beyond 2-3 weeks. Changes or symptoms that you might experience include:  Mood swings.  Restlessness, anxiety, or irritation.  Difficulty concentrating.  Dizziness.  Strong cravings for sugary foods in addition to nicotine.  Mild weight  gain.  Constipation.  Nausea.  Coughing or a sore throat.  Changes in how your medicines work in your body.  A depressed mood.  Difficulty sleeping (insomnia). After the first 2-3 weeks of quitting, you may start to notice more positive results, such as:  Improved sense of smell and taste.  Decreased coughing and sore throat.  Slower heart rate.  Lower blood pressure.  Clearer skin.  The ability to breathe more easily.  Fewer sick days. Quitting smoking is very challenging for most people. Do not get discouraged if you are not successful the first time. Some people need to make many attempts to quit before they achieve long-term success. Do your best to stick to your quit plan, and talk with your health care  provider if you have any questions or concerns. This information is not intended to replace advice given to you by your health care provider. Make sure you discuss any questions you have with your health care provider. Document Released: 01/09/2001 Document Revised: 09/13/2015 Document Reviewed: 06/01/2014 Elsevier Interactive Patient Education  2017 [employer].

## 2016-01-17 ENCOUNTER — Telehealth: Payer: Self-pay | Admitting: Physician Assistant

## 2016-01-17 NOTE — Telephone Encounter (Signed)
Pt called c/o bloating, constipation, abdominal pain, gas, and burping. Pt states he has been using laxatives and has had no improvements. Pt states he is constipated for 3 day and then finally has a bowel movement.   Due to PA being off for the rest of the week and not being able to see patient until next Wednesday. LPN advised pt to go to the hospital.   Pt verbalized understanding.

## 2016-01-19 ENCOUNTER — Other Ambulatory Visit: Payer: Self-pay | Admitting: Physician Assistant

## 2016-02-09 ENCOUNTER — Other Ambulatory Visit: Payer: Self-pay | Admitting: Physician Assistant

## 2016-02-14 ENCOUNTER — Encounter: Payer: Self-pay | Admitting: Physician Assistant

## 2016-02-14 ENCOUNTER — Ambulatory Visit: Payer: Self-pay | Admitting: Physician Assistant

## 2016-02-14 VITALS — BP 126/70 | HR 105 | Temp 97.9°F | Ht 70.75 in | Wt 196.5 lb

## 2016-02-14 DIAGNOSIS — F17218 Nicotine dependence, cigarettes, with other nicotine-induced disorders: Secondary | ICD-10-CM

## 2016-02-14 DIAGNOSIS — R1084 Generalized abdominal pain: Secondary | ICD-10-CM

## 2016-02-14 DIAGNOSIS — E785 Hyperlipidemia, unspecified: Secondary | ICD-10-CM

## 2016-02-14 DIAGNOSIS — K5900 Constipation, unspecified: Secondary | ICD-10-CM

## 2016-02-14 DIAGNOSIS — IMO0001 Reserved for inherently not codable concepts without codable children: Secondary | ICD-10-CM

## 2016-02-14 DIAGNOSIS — I10 Essential (primary) hypertension: Secondary | ICD-10-CM

## 2016-02-14 DIAGNOSIS — E1165 Type 2 diabetes mellitus with hyperglycemia: Secondary | ICD-10-CM

## 2016-02-14 NOTE — Progress Notes (Signed)
BP 126/70 (BP Location: Left Arm, Patient Position: Sitting, Cuff Size: Normal)   Pulse (!) 105   Temp 97.9 F (36.6 C)   Ht 5' 10.75" (1.797 m)   Wt 196 lb 8 oz (89.1 kg)   SpO2 99%   BMI 27.60 kg/m    Subjective:    Patient ID: Jerry Cuevas, male    DOB: 26-Dec-1955, 61 y.o.   MRN: [id number]  HPI: Jerry Cuevas is a 61 y.o. male presenting on 02/14/2016 for Hypertension and Constipation   HPI    Pt has not received meds from [facility] yet.  States stomach worse.  Says it is painful. He is still constipated.  He took laxative (mag citrate)  and says he had diarrhea 3 times the day he took it but then he was constipated again when he stopped it.  He has BM every 3 days.  Says stool is very hard.   He is using fiber daily.  Denies blood  Pt last checked his fbs 2 days ago and it was 120.   Relevant past medical, surgical, family and social history reviewed and updated as indicated. Interim medical history since our last visit reviewed. Allergies and medications reviewed and updated.   Current Outpatient Prescriptions:  .  lisinopril (PRINIVIL,ZESTRIL) 20 MG tablet, Take 1 tablet (20 mg total) by mouth daily., Disp: 90 tablet, Rfl: 2 .  metFORMIN (GLUCOPHAGE) 1000 MG tablet, TAKE ONE TABLET BY MOUTH TWICE DAILY WITH  A  MEAL, Disp: 60 tablet, Rfl: 1 .  sitaGLIPtin (JANUVIA) 100 MG tablet, Take 1 tablet (100 mg total) by mouth daily., Disp: 90 tablet, Rfl: 1 .  atorvastatin (LIPITOR) 10 MG tablet, Take 1 tablet (10 mg total) by mouth daily. (Patient not taking: Reported on 02/14/2016), Disp: 90 tablet, Rfl: 1   Review of Systems  Constitutional: Positive for appetite change, chills and unexpected weight change. Negative for diaphoresis, fatigue and fever.  HENT: Positive for dental problem. Negative for congestion, drooling, ear pain, facial swelling, hearing loss, mouth sores, sneezing, sore throat, trouble swallowing and voice change.   Eyes: Negative for pain, discharge,  redness, itching and visual disturbance.  Respiratory: Negative for cough, choking, shortness of breath and wheezing.   Cardiovascular: Negative for chest pain, palpitations and leg swelling.  Gastrointestinal: Positive for abdominal pain and constipation. Negative for blood in stool, diarrhea and vomiting.  Endocrine: Negative for cold intolerance, heat intolerance and polydipsia.  Genitourinary: Negative for decreased urine volume, dysuria and hematuria.  Musculoskeletal: Positive for back pain. Negative for arthralgias and gait problem.  Skin: Negative for rash.  Allergic/Immunologic: Negative for environmental allergies.  Neurological: Negative for seizures, syncope, light-headedness and headaches.  Hematological: Negative for adenopathy.  Psychiatric/Behavioral: Negative for agitation, dysphoric mood and suicidal ideas. The patient is not nervous/anxious.     Per HPI unless specifically indicated above     Objective:    BP 126/70 (BP Location: Left Arm, Patient Position: Sitting, Cuff Size: Normal)   Pulse (!) 105   Temp 97.9 F (36.6 C)   Ht 5' 10.75" (1.797 m)   Wt 196 lb 8 oz (89.1 kg)   SpO2 99%   BMI 27.60 kg/m   Wt Readings from Last 3 Encounters:  02/14/16 196 lb 8 oz (89.1 kg)  01/03/16 191 lb 4.8 oz (86.8 kg)  12/01/15 191 lb (86.6 kg)    Physical Exam  Constitutional: He is oriented to person, place, and time. He appears well-developed and well-nourished.  HENT:  Head: Normocephalic and atraumatic.  Neck: Neck supple.  Cardiovascular: Normal rate and regular rhythm.   Pulmonary/Chest: Effort normal and breath sounds normal. He has no wheezes.  Abdominal: Soft. Bowel sounds are normal. He exhibits no distension, no fluid wave, no ascites and no mass. There is no hepatosplenomegaly. There is no tenderness. There is no rigidity, no rebound and no guarding.  Musculoskeletal: He exhibits no edema.  Lymphadenopathy:    He has no cervical adenopathy.  Neurological:  He is alert and oriented to person, place, and time.  Skin: Skin is warm and dry.  Psychiatric: He has a normal mood and affect. His behavior is normal.  Vitals reviewed.   Results for orders placed or performed in visit on 12/03/15  IFOBT POC (occult bld, rslt in office)  Result Value Ref Range   IFOBT Negative       Assessment & Plan:    Encounter Diagnoses  Name Primary?  . Constipation, unspecified constipation type Yes  . Generalized abdominal pain   . Uncontrolled diabetes mellitus type 2 without complications, unspecified long term insulin use status ([place])   . Essential hypertension   . Hyperlipidemia, unspecified hyperlipidemia type   . Cigarette nicotine dependence with other nicotine-induced disorder     -pt is given [facility] discount application -will Refer to GI for further evaluation in light of persistent complaints and failure to improve -encouraged pt to continue to drink plenty of water -add colace  -nurse called [facility] and was told that meds should be delivered to Pt on Thursday -follow up one month.  RTO sooner prn

## 2016-02-14 NOTE — Patient Instructions (Signed)
Add colace (docusate) for stool softener.  Turn in [facility] discount application ASAP.

## 2016-02-21 ENCOUNTER — Encounter: Payer: Self-pay | Admitting: Internal Medicine

## 2016-02-25 ENCOUNTER — Emergency Department (HOSPITAL_COMMUNITY)
Admission: EM | Admit: 2016-02-25 | Discharge: 2016-02-25 | Disposition: A | Discharge status: Home / Self Care | Payer: Self-pay | Attending: Emergency Medicine | Admitting: Emergency Medicine

## 2016-02-25 ENCOUNTER — Encounter (HOSPITAL_COMMUNITY): Payer: Self-pay | Admitting: Emergency Medicine

## 2016-02-25 ENCOUNTER — Emergency Department (HOSPITAL_COMMUNITY): Payer: Self-pay

## 2016-02-25 DIAGNOSIS — K5900 Constipation, unspecified: Secondary | ICD-10-CM | POA: Insufficient documentation

## 2016-02-25 DIAGNOSIS — Z7984 Long term (current) use of oral hypoglycemic drugs: Secondary | ICD-10-CM | POA: Insufficient documentation

## 2016-02-25 DIAGNOSIS — I10 Essential (primary) hypertension: Secondary | ICD-10-CM | POA: Insufficient documentation

## 2016-02-25 DIAGNOSIS — R1084 Generalized abdominal pain: Secondary | ICD-10-CM

## 2016-02-25 DIAGNOSIS — Z79899 Other long term (current) drug therapy: Secondary | ICD-10-CM | POA: Insufficient documentation

## 2016-02-25 DIAGNOSIS — E119 Type 2 diabetes mellitus without complications: Secondary | ICD-10-CM | POA: Insufficient documentation

## 2016-02-25 DIAGNOSIS — F17210 Nicotine dependence, cigarettes, uncomplicated: Secondary | ICD-10-CM | POA: Insufficient documentation

## 2016-02-25 LAB — URINALYSIS, ROUTINE W REFLEX MICROSCOPIC
Bilirubin Urine: NEGATIVE
Glucose, UA: NEGATIVE mg/dL
Hgb urine dipstick: NEGATIVE
KETONES UR: NEGATIVE mg/dL
LEUKOCYTES UA: NEGATIVE
NITRITE: NEGATIVE
PROTEIN: NEGATIVE mg/dL
Specific Gravity, Urine: 1.005 (ref 1.005–1.030)
pH: 7 (ref 5.0–8.0)

## 2016-02-25 LAB — COMPREHENSIVE METABOLIC PANEL
ALT: 16 U/L — AB (ref 17–63)
AST: 16 U/L (ref 15–41)
Albumin: 4.1 g/dL (ref 3.5–5.0)
Alkaline Phosphatase: 94 U/L (ref 38–126)
Anion gap: 7 (ref 5–15)
BILIRUBIN TOTAL: 0.5 mg/dL (ref 0.3–1.2)
BUN: 12 mg/dL (ref 6–20)
CO2: 25 mmol/L (ref 22–32)
CREATININE: 0.85 mg/dL (ref 0.61–1.24)
Calcium: 9.5 mg/dL (ref 8.9–10.3)
Chloride: 100 mmol/L — ABNORMAL LOW (ref 101–111)
GFR calc Af Amer: >60 mL/min (ref >60)
Glucose, Bld: 178 mg/dL — ABNORMAL HIGH (ref 65–99)
POTASSIUM: 4 mmol/L (ref 3.5–5.1)
Sodium: 132 mmol/L — ABNORMAL LOW (ref 135–145)
TOTAL PROTEIN: 7.2 g/dL (ref 6.5–8.1)

## 2016-02-25 LAB — CBC
HCT: 42.7 % (ref 39.0–52.0)
Hemoglobin: 15.3 g/dL (ref 13.0–17.0)
MCH: 30 pg (ref 26.0–34.0)
MCHC: 35.8 g/dL (ref 30.0–36.0)
MCV: 83.7 fL (ref 78.0–100.0)
PLATELETS: 245 10*3/uL (ref 150–400)
RBC: 5.1 MIL/uL (ref 4.22–5.81)
RDW: 12.3 % (ref 11.5–15.5)
WBC: 13.4 10*3/uL — ABNORMAL HIGH (ref 4.0–10.5)

## 2016-02-25 LAB — LIPASE, BLOOD: Lipase: 36 U/L (ref 11–51)

## 2016-02-25 MED ORDER — METOCLOPRAMIDE HCL 10 MG PO TABS: 10.0000 mg | ORAL_TABLET | Freq: Three times a day (TID) | ORAL | 0 refills | Status: DC

## 2016-02-25 MED ORDER — IOPAMIDOL (ISOVUE-300) INJECTION 61%
100.0000 mL | Freq: Once | INTRAVENOUS | Status: AC | PRN
  Administered 2016-02-25: 100 mL via INTRAVENOUS

## 2016-02-25 NOTE — Discharge Instructions (Signed)
Take the prescription as directed.  Call your regular medical doctor and the GI doctor on Monday to schedule a follow up appointment this week.  Return to the Emergency Department immediately sooner if worsening.  ° °

## 2016-02-25 NOTE — ED Triage Notes (Signed)
Pt with months history of abd pain with constipation. [facility] and that they are trying to get him an appointment with a GI specialist, but have not caleld yet. He also reports chronic constipation- was recommended to try mag citrate after minimal results with other meds. He describes pain as burning and occas. Sharp to the left side . Last seen at clinic 2 weeks ago

## 2016-02-25 NOTE — ED Provider Notes (Signed)
[facility] DEPT Provider Note   CSN: [id number] Arrival date & time: 02/25/16  1352     History   Chief Complaint Chief Complaint  Patient presents with  . Abdominal Pain    x 3 months  . Constipation    last BM 4 days ago    HPI Jerry Cuevas is a 61 y.o. male.  HPI  Pt was seen at 1540.  Per pt, c/o gradual onset and persistence of constant generalized abd "pain" for the past several months.  Has been associated with constipation.  Describes the abd pain as "bloating."  States he has been evaluated by the Clinic several times for this complaint, rx laxatives with resultant BM "but it doesn't help the pain." Denies N/V, no diarrhea, no fevers, no back pain, no rash, no CP/SOB, no black or blood in stools.      Past Medical History:  Diagnosis Date  . Anxiety   . Depression   . Diabetes mellitus without complication ([place]) [id number]  . GERD (gastroesophageal reflux disease)   . Hypertension 2007    Patient Active Problem List   Diagnosis Date Noted  . Uncontrolled diabetes mellitus type 2 without complications ([place]) [id number]  . Essential hypertension 12/03/2015  . Hyperlipidemia 12/03/2015  . Anxiety 12/03/2015  . Nicotine dependence 12/03/2015    Past Surgical History:  Procedure Laterality Date  . TONSILLECTOMY    . TYMPANOPLASTY Right age 61       Home Medications    Prior to Admission medications   Medication Sig Start Date End Date Taking? Authorizing Provider  atorvastatin (LIPITOR) 10 MG tablet Take 1 tablet (10 mg total) by mouth daily. Patient not taking: Reported on 02/14/2016 01/03/16   Soyla Dryer, PA-C  lisinopril (PRINIVIL,ZESTRIL) 20 MG tablet Take 1 tablet (20 mg total) by mouth daily. 01/03/16   Soyla Dryer, PA-C  metFORMIN (GLUCOPHAGE) 1000 MG tablet TAKE ONE TABLET BY MOUTH TWICE DAILY WITH  A  MEAL 02/09/16   Soyla Dryer, PA-C  sitaGLIPtin (JANUVIA) 100 MG tablet Take 1 tablet (100 mg total) by mouth daily. 01/03/16   Soyla Dryer, PA-C    Family History Family History  Problem Relation Age of Onset  . Diabetes Father 68  . Heart disease Father   . Heart failure Father   . Cancer Maternal Grandfather     Social History Social History  Substance Use Topics  . Smoking status: Current Every Day Smoker    Packs/day: 1.00    Years: 35.00    Types: Cigarettes  . Smokeless tobacco: Never Used     Comment: has bought lozenges to quit  . Alcohol use No     Allergies   Morphine and related   Review of Systems Review of Systems ROS: Statement: All systems negative except as marked or noted in the HPI; Constitutional: Negative for fever and chills. ; ; Eyes: Negative for eye pain, redness and discharge. ; ; ENMT: Negative for ear pain, hoarseness, nasal congestion, sinus pressure and sore throat. ; ; Cardiovascular: Negative for chest pain, palpitations, diaphoresis, dyspnea and peripheral edema. ; ; Respiratory: Negative for cough, wheezing and stridor. ; ; Gastrointestinal: +abd pain, constipation. Negative for nausea, vomiting, diarrhea, blood in stool, hematemesis, jaundice and rectal bleeding. . ; ; Genitourinary: Negative for dysuria, flank pain and hematuria. ; ; Musculoskeletal: Negative for back pain and neck pain. Negative for swelling and trauma.; ; Skin: Negative for pruritus, rash, abrasions, blisters, bruising and skin lesion.; ; Neuro: Negative  for headache, lightheadedness and neck stiffness. Negative for weakness, altered level of consciousness, altered mental status, extremity weakness, paresthesias, involuntary movement, seizure and syncope.      Physical Exam Updated Vital Signs BP 127/90 (BP Location: Left Arm)   Pulse 118   Temp 98.6 F (37 C)   Resp 17   Ht 5\' 10"  (1.778 m)   Wt 190 lb (86.2 kg)   SpO2 99%   BMI 27.26 kg/m   Physical Exam 1545: Physical examination:  Nursing notes reviewed; Vital signs and O2 SAT reviewed;  Constitutional: Well developed, Well nourished,  Well hydrated, In no acute distress; Head:  Normocephalic, atraumatic; Eyes: EOMI, PERRL, No scleral icterus; ENMT: Mouth and pharynx normal, Mucous membranes moist; Neck: Supple, Full range of motion, No lymphadenopathy; Cardiovascular: Regular rate and rhythm, No gallop; Respiratory: Breath sounds clear & equal bilaterally, No wheezes.  Speaking full sentences with ease, Normal respiratory effort/excursion; Chest: Nontender, Movement normal; Abdomen: Soft, +diffuse tenderness to palp. No rebound or guarding. Nondistended, Normal bowel sounds; Genitourinary: No CVA tenderness; Extremities: Pulses normal, No tenderness, No edema, No calf edema or asymmetry.; Neuro: AA&Ox3, Major CN grossly intact.  Speech clear. No gross focal motor or sensory deficits in extremities. Climbs on and off stretcher easily by himself. Gait steady.; Skin: Color normal, Warm, Dry.   ED Treatments / Results  Labs (all labs ordered are listed, but only abnormal results are displayed)   EKG  EKG Interpretation None       Radiology   Procedures Procedures (including critical care time)  Medications Ordered in ED Medications - No data to display   Initial Impression / Assessment and Plan / ED Course  I have reviewed the triage vital signs and the nursing notes.  Pertinent labs & imaging results that were available during my care of the patient were reviewed by me and considered in my medical decision making (see chart for details).  MDM Reviewed: previous chart, nursing note and vitals Reviewed previous: labs Interpretation: labs and CT scan   Results for orders placed or performed during the hospital encounter of 02/25/16  Lipase, blood  Result Value Ref Range   Lipase 36 11 - 51 U/L  Comprehensive metabolic panel  Result Value Ref Range   Sodium 132 (L) 135 - 145 mmol/L   Potassium 4.0 3.5 - 5.1 mmol/L   Chloride 100 (L) 101 - 111 mmol/L   CO2 25 22 - 32 mmol/L   Glucose, Bld 178 (H) 65 - 99  mg/dL   BUN 12 6 - 20 mg/dL   Creatinine, Ser 0.85 0.61 - 1.24 mg/dL   Calcium 9.5 8.9 - 10.3 mg/dL   Total Protein 7.2 6.5 - 8.1 g/dL   Albumin 4.1 3.5 - 5.0 g/dL   AST 16 15 - 41 U/L   ALT 16 (L) 17 - 63 U/L   Alkaline Phosphatase 94 38 - 126 U/L   Total Bilirubin 0.5 0.3 - 1.2 mg/dL   GFR calc non Af Amer >60 >60 mL/min   GFR calc Af Amer >60 >60 mL/min   Anion gap 7 5 - 15  CBC  Result Value Ref Range   WBC 13.4 (H) 4.0 - 10.5 K/uL   RBC 5.10 4.22 - 5.81 MIL/uL   Hemoglobin 15.3 13.0 - 17.0 g/dL   HCT 42.7 39.0 - 52.0 %   MCV 83.7 78.0 - 100.0 fL   MCH 30.0 26.0 - 34.0 pg   MCHC 35.8 30.0 - 36.0 g/dL  RDW 12.3 11.5 - 15.5 %   Platelets 245 150 - 400 K/uL  Urinalysis, Routine w reflex microscopic  Result Value Ref Range   Color, Urine YELLOW YELLOW   APPearance CLEAR CLEAR   Specific Gravity, Urine 1.005 1.005 - 1.030   pH 7.0 5.0 - 8.0   Glucose, UA NEGATIVE NEGATIVE mg/dL   Hgb urine dipstick NEGATIVE NEGATIVE   Bilirubin Urine NEGATIVE NEGATIVE   Ketones, ur NEGATIVE NEGATIVE mg/dL   Protein, ur NEGATIVE NEGATIVE mg/dL   Nitrite NEGATIVE NEGATIVE   Leukocytes, UA NEGATIVE NEGATIVE   Ct Abdomen Pelvis W Contrast Result Date: 02/25/2016 CLINICAL DATA:  61 year old male with right-sided abdominal and pelvic pain for several months. EXAM: CT ABDOMEN AND PELVIS WITH CONTRAST TECHNIQUE: Multidetector CT imaging of the abdomen and pelvis was performed using the standard protocol following bolus administration of intravenous contrast. CONTRAST:  117mL ISOVUE-300 IOPAMIDOL (ISOVUE-300) INJECTION 61% COMPARISON:  None. FINDINGS: Lower chest: Unremarkable Hepatobiliary: The liver and gallbladder are unremarkable. There is no evidence of biliary dilatation. Pancreas: Unremarkable Spleen: Unremarkable Adrenals/Urinary Tract: The kidneys, adrenal glands and bladder are unremarkable except for right renal cysts. Stomach/Bowel: Stomach is within normal limits. Appendix appears normal.  No evidence of bowel wall thickening, distention, or inflammatory changes. Vascular/Lymphatic: No significant vascular findings are present. No enlarged abdominal or pelvic lymph nodes. Reproductive: Prostate enlargement noted. Other: No abdominal wall hernia or abnormality. No abdominopelvic ascites. Musculoskeletal: Moderate degenerative disc disease with endplate changes at [id number] noted. IMPRESSION: No evidence of acute abnormality. Prostate enlargement. Moderate degenerative changes at L2-3. Electronically Signed   By: Margarette Canada M.D.   On: 02/25/2016 17:43    1815:  Pt has tol PO well while in the ED without N/V.  No stooling while in the ED.  Abd benign, VSS. Feels better and wants to go home now. Dx and testing d/w pt.  Questions answered.  Verb understanding, agreeable to d/c home with outpt f/u.    Final Clinical Impressions(s) / ED Diagnoses   Final diagnoses:  None    New Prescriptions New Prescriptions   No medications on file     Francine Graven, DO 02/28/16 1228

## 2016-02-28 ENCOUNTER — Telehealth: Payer: Self-pay | Admitting: Physician Assistant

## 2016-02-28 ENCOUNTER — Other Ambulatory Visit: Payer: Self-pay | Admitting: Physician Assistant

## 2016-02-28 MED ORDER — METOCLOPRAMIDE HCL 10 MG PO TABS: 10.0000 mg | ORAL_TABLET | Freq: Three times a day (TID) | ORAL | 0 refills | Status: DC

## 2016-02-28 NOTE — Telephone Encounter (Signed)
Feb 27, 2016 Pt called 02-27-2016 stating he went to [facility] and was diagnosed with gastroparesis and was prescribed Reglan 10 mg. Pt states medication is working well with his abd pain and was calling to request more to have until his New Patient appointment with GI on Feb. 7, 2018. Pt advised that LPN would talk to PA and give him a call back.  Feb 28, 2016 PA rx refill on Reglan and e-scribed to [facility] in [place] for pt to have just enough until his appointment with GI on Feb. 7. Pt called and was notified. Pt verbalized understanding.

## 2016-03-06 ENCOUNTER — Other Ambulatory Visit: Payer: Self-pay

## 2016-03-06 DIAGNOSIS — E1165 Type 2 diabetes mellitus with hyperglycemia: Principal | ICD-10-CM

## 2016-03-06 DIAGNOSIS — IMO0001 Reserved for inherently not codable concepts without codable children: Secondary | ICD-10-CM

## 2016-03-06 DIAGNOSIS — I10 Essential (primary) hypertension: Secondary | ICD-10-CM

## 2016-03-06 DIAGNOSIS — E785 Hyperlipidemia, unspecified: Secondary | ICD-10-CM

## 2016-03-07 ENCOUNTER — Ambulatory Visit (INDEPENDENT_AMBULATORY_CARE_PROVIDER_SITE_OTHER): Payer: Self-pay | Admitting: Gastroenterology

## 2016-03-07 ENCOUNTER — Other Ambulatory Visit: Payer: Self-pay

## 2016-03-07 ENCOUNTER — Telehealth: Payer: Self-pay

## 2016-03-07 ENCOUNTER — Encounter: Payer: Self-pay | Admitting: Gastroenterology

## 2016-03-07 VITALS — BP 133/76 | HR 113 | Temp 97.8°F | Ht 70.5 in | Wt 188.4 lb

## 2016-03-07 DIAGNOSIS — R1013 Epigastric pain: Secondary | ICD-10-CM | POA: Insufficient documentation

## 2016-03-07 DIAGNOSIS — Z1211 Encounter for screening for malignant neoplasm of colon: Secondary | ICD-10-CM

## 2016-03-07 DIAGNOSIS — K5900 Constipation, unspecified: Secondary | ICD-10-CM

## 2016-03-07 DIAGNOSIS — R634 Abnormal weight loss: Secondary | ICD-10-CM

## 2016-03-07 MED ORDER — PANTOPRAZOLE SODIUM 40 MG PO TBEC: 40.0000 mg | DELAYED_RELEASE_TABLET | Freq: Every day | ORAL | 3 refills | Status: AC

## 2016-03-07 NOTE — Telephone Encounter (Signed)
As pt was leaving the office today, I was explaining to him that we would fax his information to the health dept for assistance with linzess 226mcg. Pt informed me that he goes to the [facility] and they have gotten him approved with [facility] in [place] and have gotten his medications for free. I called [facility] and they do not get Linzess and suggested the linzess patient assistance , I also asked her about the pantoprazole and she checked because she said they normally dont have it but sometimes they get some in. They do have some at this time and I gave her the rx that AB had sent in to [place]. They will be shipping the pantoprazole to him and he is aware. I have finished the paperwork for the health dept and it is on AB desk.

## 2016-03-07 NOTE — Patient Instructions (Signed)
I have sent in a medication called Protonix to your pharmacy (for reflux). Take this 30 minutes before breakfast daily.   For constipation: start taking Linzess 290 mcg once each morning on an empty stomach. I have given you samples, and we will get patient assistance.   For immediate constipation relief: take Miralax 1 capful every hour followed by 8 ounces of water, repeat up to 6 doses or until you start having results. You may repeat tomorrow.   We have scheduled you for a colonoscopy and possible upper endoscopy with Dr. Oneida Alar in the near future.

## 2016-03-07 NOTE — Progress Notes (Addendum)
REVIEWED-NO ADDITIONAL RECOMMENDATIONS.  Primary Care Physician:  Soyla Dryer, PA-C Primary Gastroenterologist:  Dr. Oneida Alar   Chief Complaint  Patient presents with  . Constipation  . Abdominal Pain    HPI:   Jerry Cuevas is a 61 y.o. male presenting today at the request of his PCP secondary to constipation and abdominal pain.   No appetite, a year ago weighed 225 per his report, today 188.  Hard to eat, having to force himself to eat. Drinking a lot of protein shakes. CT Feb 25, 2016 with prostate enlargement, no evidence of acute abnormality. Prescribed Reglan by ED, which didn't help. Bloating, belching, constipation. BM every 3-4 days. Doesn't feel like he can finish. Sometimes Bristol scale #3, sometimes #6. When it's a 6# it is described as "sticky". Takes an occasional laxative. No prescriptive agents for constipation. Scant hematochezia with straining. No PPI. No dysphagia. Pain is located LLQ, runs across lower abdomen/across umbilicus. Feels like everything is vibrating. No nausea or vomiting. Notes early satiety. Has only had black coffee and water. Feels like he has muscle weakness. 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 Getting harder to care for her due to weakness. No prior colonoscopy or upper endoscopy. Bloating improved after BM but not pain. Dealing with insomnia.   Past Medical History:  Diagnosis Date  . Anxiety   . Depression   . Diabetes mellitus without complication ([place]) [id number]  . GERD (gastroesophageal reflux disease)   . Hypertension 2007  . Metabolic syndrome     Past Surgical History:  Procedure Laterality Date  . TONSILLECTOMY    . TYMPANOPLASTY Right age 45    Current Outpatient Prescriptions  Medication Sig Dispense Refill  . atorvastatin (LIPITOR) 10 MG tablet Take 1 tablet (10 mg total) by mouth daily. 90 tablet 1  . lisinopril (PRINIVIL,ZESTRIL) 20 MG tablet Take 1 tablet (20 mg total) by mouth daily. 90 tablet 2  . metFORMIN  (GLUCOPHAGE) 1000 MG tablet TAKE ONE TABLET BY MOUTH TWICE DAILY WITH  A  MEAL 60 tablet 1  . sitaGLIPtin (JANUVIA) 100 MG tablet Take 1 tablet (100 mg total) by mouth daily. 90 tablet 1  . pantoprazole (PROTONIX) 40 MG tablet Take 1 tablet (40 mg total) by mouth daily. Take 30 minutes before breakfast. 30 tablet 3   No current facility-administered medications for this visit.     Allergies as of 03/07/2016 - Review Complete 03/07/2016  Allergen Reaction Noted  . Morphine and related Nausea And Vomiting and Nausea Only 11/30/2015    Family History  Problem Relation Age of Onset  . Diabetes Father 100  . Heart disease Father   . Heart failure Father   . Prostate cancer Father   . Cancer Maternal Grandfather   . Colon cancer Neg Hx     no first degree relatives with colon cancer. Maternal uncle had colon cancer     Social History   Social History  . Marital status: Divorced    Spouse name: N/A  . Number of children: N/A  . Years of education: N/A   Occupational History  . Unemployed    Social History Main Topics  . Smoking status: Current Every Day Smoker    Packs/day: 1.00    Years: 35.00    Types: Cigarettes  . Smokeless tobacco: Never Used     Comment: has bought lozenges to quit  . Alcohol use No  . Drug use: No  . Sexual activity: Not on file   Other  Topics Concern  . Not on file   Social History Narrative  . No narrative on file    Review of Systems: As mentioned in HPI   Physical Exam: BP 133/76   Pulse (!) 113   Temp 97.8 F (36.6 C) (Oral)   Ht 5' 10.5" (1.791 m)   Wt 188 lb 6.4 oz (85.5 kg)   BMI 26.65 kg/m  General:   Alert and oriented. Pleasant and cooperative. Well-nourished and well-developed.  Head:  Normocephalic and atraumatic. Eyes:  Without icterus, sclera clear and conjunctiva pink.  Ears:  Normal auditory acuity. Nose:  No deformity, discharge,  or lesions. Mouth:  No deformity or lesions, oral mucosa pink.  Lungs:  Clear to  auscultation bilaterally. No wheezes, rales, or rhonchi. No distress.  Heart:  S1, S2 present without murmurs appreciated.  Abdomen:  +BS, soft, non-tender and non-distended. No HSM noted. No guarding or rebound. No masses appreciated.  Rectal:  Deferred  Msk:  Symmetrical without gross deformities. Normal posture. Extremities:  Without  edema. Neurologic:  Alert and  oriented x4 Skin:  Intact without significant lesions or rashes. Psych:  Alert and cooperative. Normal mood and affect.  Lab Results  Component Value Date   WBC 13.4 (H) 02/25/2016   HGB 15.3 02/25/2016   HCT 42.7 02/25/2016   MCV 83.7 02/25/2016   PLT 245 02/25/2016   Lab Results  Component Value Date   ALT 9 03/06/2016   AST 14 03/06/2016   ALKPHOS 98 03/06/2016   BILITOT 0.7 03/06/2016   Lab Results  Component Value Date   CREATININE 0.88 03/06/2016   BUN 11 03/06/2016   NA 131 (L) 03/06/2016   K 4.3 03/06/2016   CL 99 03/06/2016   CO2 25 03/06/2016   Lab Results  Component Value Date   TSH 2.44 12/06/2015

## 2016-03-08 LAB — LIPID PANEL
Cholesterol: 179 mg/dL (ref <200)
HDL: 36 mg/dL — AB (ref >40)
LDL CALC: 88 mg/dL (ref <100)
Total CHOL/HDL Ratio: 5 Ratio — ABNORMAL HIGH (ref <5.0)
Triglycerides: 274 mg/dL — ABNORMAL HIGH (ref <150)
VLDL: 55 mg/dL — ABNORMAL HIGH (ref <30)

## 2016-03-08 LAB — COMPREHENSIVE METABOLIC PANEL
ALBUMIN: 4.3 g/dL (ref 3.6–5.1)
ALK PHOS: 98 U/L (ref 40–115)
ALT: 9 U/L (ref 9–46)
AST: 14 U/L (ref 10–35)
BILIRUBIN TOTAL: 0.7 mg/dL (ref 0.2–1.2)
BUN: 11 mg/dL (ref 7–25)
CALCIUM: 9 mg/dL (ref 8.6–10.3)
CO2: 25 mmol/L (ref 20–31)
CREATININE: 0.88 mg/dL (ref 0.70–1.25)
Chloride: 99 mmol/L (ref 98–110)
GLUCOSE: 156 mg/dL — AB (ref 65–99)
Potassium: 4.3 mmol/L (ref 3.5–5.3)
SODIUM: 131 mmol/L — AB (ref 135–146)
Total Protein: 7.1 g/dL (ref 6.1–8.1)

## 2016-03-08 LAB — HEMOGLOBIN A1C
HEMOGLOBIN A1C: 9.1 % — AB (ref <5.7)
MEAN PLASMA GLUCOSE: 214 mg/dL

## 2016-03-09 DIAGNOSIS — R634 Abnormal weight loss: Secondary | ICD-10-CM | POA: Insufficient documentation

## 2016-03-09 NOTE — Assessment & Plan Note (Signed)
61 year old male with no prior colonoscopy, presenting with documented weight loss, constipation, early satiety, abdominal pain that is not improved after bowel movement. CT on file with prostate enlargement but no other acute findings. Scant hematochezia in past with straining but no significant rectal bleeding. No first-degree relatives with colon cancer. Will proceed with colonoscopy and +/- EGD (if no significant findings on colonoscopy and/or no improvement in dyspepsia. See dyspepsia).  Proceed with colonoscopy with Dr. Oneida Alar in the near future. The risks, benefits, and alternatives have been discussed in detail with the patient. They state understanding and desire to proceed.  Phenergan 25 mg IV on call

## 2016-03-09 NOTE — Assessment & Plan Note (Signed)
Early satiety, belching, unintentional weight loss. Start Protonix once each morning. May need EGD at time of colonoscopy if symptoms not resolved or if colonoscopy negative due to constellation of symptoms and notable weight loss.

## 2016-03-09 NOTE — Assessment & Plan Note (Signed)
Start Linzess 290 mcg once daily. Samples provided and patient assistance forms.

## 2016-03-12 ENCOUNTER — Telehealth: Payer: Self-pay | Admitting: Gastroenterology

## 2016-03-12 NOTE — Progress Notes (Signed)
cc'ed to pcp °

## 2016-03-12 NOTE — Telephone Encounter (Signed)
Tried to call pt and he is unavailable.

## 2016-03-12 NOTE — Telephone Encounter (Signed)
[id number] PLEASE CALL PATIENT, LINZESS IS WORKING TOO WELL AND HE IS LOSING FLUIDS

## 2016-03-13 ENCOUNTER — Other Ambulatory Visit: Payer: Self-pay | Admitting: Gastroenterology

## 2016-03-13 MED ORDER — LINACLOTIDE 290 MCG PO CAPS: 290.0000 ug | ORAL_CAPSULE | Freq: Every day | ORAL | 3 refills | Status: DC

## 2016-03-13 NOTE — Telephone Encounter (Signed)
Tried to call, pt is unable per recording.

## 2016-03-15 ENCOUNTER — Ambulatory Visit: Payer: Self-pay | Admitting: Physician Assistant

## 2016-03-15 ENCOUNTER — Encounter: Payer: Self-pay | Admitting: Physician Assistant

## 2016-03-15 VITALS — BP 110/68 | HR 103 | Wt 190.0 lb

## 2016-03-15 DIAGNOSIS — E785 Hyperlipidemia, unspecified: Secondary | ICD-10-CM

## 2016-03-15 DIAGNOSIS — IMO0001 Reserved for inherently not codable concepts without codable children: Secondary | ICD-10-CM

## 2016-03-15 DIAGNOSIS — R1084 Generalized abdominal pain: Secondary | ICD-10-CM

## 2016-03-15 DIAGNOSIS — E1165 Type 2 diabetes mellitus with hyperglycemia: Principal | ICD-10-CM

## 2016-03-15 DIAGNOSIS — K5900 Constipation, unspecified: Secondary | ICD-10-CM

## 2016-03-15 DIAGNOSIS — I10 Essential (primary) hypertension: Secondary | ICD-10-CM

## 2016-03-15 DIAGNOSIS — F17218 Nicotine dependence, cigarettes, with other nicotine-induced disorders: Secondary | ICD-10-CM

## 2016-03-15 NOTE — Patient Instructions (Addendum)
DIABETIC EYE EXAM APPOINTMENT [facility] [place] [address] [phone] Tuesday, April 03, 2016 @ 9 AM Dr. Rona Ravens     How and Where to Give Subcutaneous Insulin Injections, Adult People with type 1 diabetes must take insulin since their bodies do not make it. People with type 2 diabetes may require insulin. There are many different types of insulin as well as other injectable diabetes medicines that are meant to be injected into the fat layer under your skin. The type of insulin or injectable diabetes medicine you take may determine how many injections you give yourself and when to take the injections. Choosing a site for injection Insulin absorption varies from site to site. As with any injectable medication it is best for the insulin to be injected within the same body region. However, do not inject the insulin in the same spot each time. Rotating the spots you give your injections will prevent inflammation or tissue breakdown. There are four main regions that can be used for injections. The regions include the:  Abdomen (preferred region, especially for non-insulin injectable diabetes medicine).  Front and upper outer sides of thighs.  Back of upper arm.  Buttocks. Using a syringe and vial Drawing up insulin: single insulin dose  1. Wash your hands with soap and water. 2. Gently roll the insulin bottle (vial) between your hands to mix it. Do not shake the vial. 3. Clean the top rubber part of the vial with an alcohol wipe. Be sure that the plastic pop-top has been removed on newer vials. 4. Remove the plastic cover from the needle on the syringe. Do not let the needle touch anything. 5. Pull the plunger back to draw air into the syringe. The air should be the same amount as the insulin dose. 6. Push the needle through the rubber on the top of the vial. Do not turn the vial over. 7. Push the plunger in all the way to put the air into the vial. 8. Leave the needle in  the vial and turn the vial and syringe upside down. 9. Pull down slowly on the plunger, drawing the amount of insulin you need into the syringe. 10. Look for air bubbles in the syringe. You may need to push the plunger up and down 2 to 3 times to slowly get rid of any air bubbles in the syringe. 11. Pull back the plunger to get your correct dose. 12. Remove the needle from the vial. 13. Use an alcohol wipe to clean the area of the body to be injected. 14. Pinch up 1 inch of skin and hold it. 15. Put the needle straight into the skin (90-degree angle). Put the needle in as far as it will go (to the hub). The needle may need to be injected at a 45-degree angle in small adults with little fat. 16. When the needle is in, you can let go of your skin. 17. Push the plunger down all the way to inject the insulin. 18. Pull the needle straight out of the skin. 19. Press the alcohol wipe over the spot where you gave your injection. Keep it there for a few seconds. Do not rub the area. 20. Do not put the plastic cover back on the needle. Drawing up insulin: mixing 2 insulins 1. Wash your hands with soap and water. 2. Gently roll the vial of "cloudy" insulin between your hands or rotate the vial from top to bottom to mix. 3. Clean the top  of both vials with an alcohol wipe. Be sure that the plastic pop-top lid has been removed on newer vials. 4. Pull air into the syringe to equal the dose of "cloudy" insulin. 5. Stick the needle into the "cloudy" insulin vial and inject the air. Be sure to keep the vial upright. 6. Remove the needle from the "cloudy" insulin vial. 7. Pull air into the syringe to equal the dose of "clear" insulin. 8. Stick the needle into the "clear" insulin vial and inject the air. 9. Leave the needle in the "clear" insulin vial and turn the vial upside down. 10. Pull down on the plunger and slowly draw into the syringe the number of units of "clear" insulin desired. 11. Look for air  bubbles in the syringe. You may need to push the plunger up and down 2 to 3 times to slowly get rid of any air bubbles in the syringe. 12. Remove the needle from the "clear" insulin vial. 13. Stick the needle into the "cloudy" insulin vial. Do not inject any of the "clear" insulin into the "cloudy" vial. 14. Turn the "cloudy" vial upside down and pull the plunger down to the number of units that equals the total number of units of "clear" and "cloudy" insulins. 15. Remove the needle from the "cloudy" insulin vial. 16. Use an alcohol wipe to clean the area of the body to be injected. 17. Put the needle straight into the skin (90-degree angle). Put the needle in as far as it will go (to the hub). The needle may need to be injected at a 45-degree angle in small adults with little fat. 18. When the needle is in, you can let go of your skin. 19. Push the plunger down all the way to inject the insulin. 20. Pull the needle straight out of the skin. 21. Press the alcohol wipe over the spot where you gave your injection. Keep it there for a few seconds. Do not rub the area. 22. Do not put the plastic cover back on the needle. Using an insulin pen  1. Wash your hands with soap and water. 2. If you are using the "cloudy" insulin, roll the pen between your palms several times or rotate the pen top to bottom several times. 3. Remove the insulin pen cap. 4. Clean the rubber stopper of the cartridge with an alcohol wipe. 5. Remove the protective paper tab from the disposable needle. 6. Screw the needle onto the pen. 7. Remove the outer plastic needle cover. 8. Remove the inner plastic needle cover. 9. Prime the insulin pen by turning the button (dial) to 2 units. Hold the pen with the needle pointing up, and push the dial on the opposite end until a drop of insulin appears at the needle tip. If no insulin appears, repeat this step. 10. Dial the number of units of insulin you will inject. 11. Use an alcohol  wipe to clean the area of the body to be injected. 12. Pinch up 1 inch of skin and hold it. 13. Put the needle straight into the skin (90-degree angle). 14. Push the dial down to push the insulin into the fat tissue. 15. Count to 10 slowly. Then, remove the needle from the fat tissue. 16. Carefully replace the larger outer plastic needle cover over the needle and unscrew the capped needle. Throwing away supplies  Discard used needles in a puncture proof sharps disposal container. Follow disposal regulations for the area where you live.  Vials and empty  disposable pens may be thrown away in the regular trash. This information is not intended to replace advice given to you by your health care provider. Make sure you discuss any questions you have with your health care provider. Document Released: 04/07/2003 Document Revised: 06/23/2015 Document Reviewed: 06/24/2012 Elsevier Interactive Patient Education  2017 [employer].

## 2016-03-15 NOTE — Progress Notes (Signed)
BP 110/68 (BP Location: Left Arm, Patient Position: Sitting, Cuff Size: Normal)   Pulse (!) 103   Wt 190 lb (86.2 kg)   SpO2 99%   BMI 26.88 kg/m    Subjective:    Patient ID: Jerry Cuevas, male    DOB: 09/02/1955, 61 y.o.   MRN: [id number]  HPI: Jerry Cuevas is a 61 y.o. male presenting on 03/15/2016 for Follow-up   HPI   Pt is seeing GI now.  Has colonoscopy 04/02/16  Relevant past medical, surgical, family and social history reviewed and updated as indicated. Interim medical history since our last visit reviewed. Allergies and medications reviewed and updated.   Current Outpatient Prescriptions:  .  atorvastatin (LIPITOR) 10 MG tablet, Take 1 tablet (10 mg total) by mouth daily., Disp: 90 tablet, Rfl: 1 .  lisinopril (PRINIVIL,ZESTRIL) 20 MG tablet, Take 1 tablet (20 mg total) by mouth daily., Disp: 90 tablet, Rfl: 2 .  metFORMIN (GLUCOPHAGE) 1000 MG tablet, TAKE ONE TABLET BY MOUTH TWICE DAILY WITH  A  MEAL, Disp: 60 tablet, Rfl: 1 .  sitaGLIPtin (JANUVIA) 100 MG tablet, Take 1 tablet (100 mg total) by mouth daily., Disp: 90 tablet, Rfl: 1 .  linaclotide (LINZESS) 290 MCG CAPS capsule, Take 1 capsule (290 mcg total) by mouth daily before breakfast. (Patient not taking: Reported on 03/15/2016), Disp: 90 capsule, Rfl: 3 .  pantoprazole (PROTONIX) 40 MG tablet, Take 1 tablet (40 mg total) by mouth daily. Take 30 minutes before breakfast. (Patient not taking: Reported on 03/15/2016), Disp: 30 tablet, Rfl: 3   Review of Systems  Constitutional: Positive for appetite change, fatigue and unexpected weight change. Negative for chills, diaphoresis and fever.  HENT: Positive for dental problem. Negative for congestion, drooling, ear pain, facial swelling, hearing loss, mouth sores, sneezing, sore throat, trouble swallowing and voice change.   Eyes: Negative for pain, discharge, redness, itching and visual disturbance.  Respiratory: Positive for wheezing. Negative for cough, choking and  shortness of breath.   Cardiovascular: Negative for chest pain, palpitations and leg swelling.  Gastrointestinal: Positive for abdominal pain and constipation. Negative for blood in stool, diarrhea and vomiting.  Endocrine: Negative for cold intolerance, heat intolerance and polydipsia.  Genitourinary: Negative for decreased urine volume, dysuria and hematuria.  Musculoskeletal: Positive for arthralgias, back pain and gait problem.  Skin: Negative for rash.  Allergic/Immunologic: Negative for environmental allergies.  Neurological: Negative for seizures, syncope, light-headedness and headaches.  Hematological: Negative for adenopathy.  Psychiatric/Behavioral: Positive for dysphoric mood. Negative for agitation and suicidal ideas. The patient is not nervous/anxious.     Per HPI unless specifically indicated above     Objective:    BP 110/68 (BP Location: Left Arm, Patient Position: Sitting, Cuff Size: Normal)   Pulse (!) 103   Wt 190 lb (86.2 kg)   SpO2 99%   BMI 26.88 kg/m   Wt Readings from Last 3 Encounters:  03/15/16 190 lb (86.2 kg)  03/07/16 188 lb 6.4 oz (85.5 kg)  02/25/16 190 lb (86.2 kg)    Physical Exam  Constitutional: He is oriented to person, place, and time. He appears well-developed and well-nourished.  HENT:  Head: Normocephalic and atraumatic.  Neck: Neck supple.  Cardiovascular: Normal rate and regular rhythm.   Pulmonary/Chest: Effort normal and breath sounds normal. He has no wheezes.  Abdominal: Soft. Bowel sounds are normal. There is no hepatosplenomegaly. There is no tenderness.  Musculoskeletal: He exhibits no edema.  Lymphadenopathy:    He has no  cervical adenopathy.  Neurological: He is alert and oriented to person, place, and time.  Skin: Skin is warm and dry.  Psychiatric: He has a normal mood and affect. His behavior is normal.  Vitals reviewed.   Results for orders placed or performed in visit on 03/06/16  Comprehensive Metabolic Panel  (CMET)  Result Value Ref Range   Sodium 131 (L) 135 - 146 mmol/L   Potassium 4.3 3.5 - 5.3 mmol/L   Chloride 99 98 - 110 mmol/L   CO2 25 20 - 31 mmol/L   Glucose, Bld 156 (H) 65 - 99 mg/dL   BUN 11 7 - 25 mg/dL   Creat 0.88 0.70 - 1.25 mg/dL   Total Bilirubin 0.7 0.2 - 1.2 mg/dL   Alkaline Phosphatase 98 40 - 115 U/L   AST 14 10 - 35 U/L   ALT 9 9 - 46 U/L   Total Protein 7.1 6.1 - 8.1 g/dL   Albumin 4.3 3.6 - 5.1 g/dL   Calcium 9.0 8.6 - 10.3 mg/dL  Lipid panel  Result Value Ref Range   Cholesterol 179 <200 mg/dL   Triglycerides 274 (H) <150 mg/dL   HDL 36 (L) >40 mg/dL   Total CHOL/HDL Ratio 5.0 (H) <5.0 Ratio   VLDL 55 (H) <30 mg/dL   LDL Cholesterol 88 <100 mg/dL  Hemoglobin A1c  Result Value Ref Range   Hgb A1c MFr Bld 9.1 (H) <5.7 %   Mean Plasma Glucose 214 mg/dL      Assessment & Plan:   Encounter Diagnoses  Name Primary?  Marland Kitchen Uncontrolled diabetes mellitus type 2 without complications, unspecified long term insulin use status ([place]) Yes  . Hyperlipidemia, unspecified hyperlipidemia type   . Essential hypertension   . Cigarette nicotine dependence with other nicotine-induced disorder   . Constipation, unspecified constipation type   . Generalized abdominal pain     -reviewed labs with pt -pt to Continue low fat, diabetic diet -Add fish oil 1 daily -Continue current medications. -Add lantus 10 u qhs.  Pt is taught how to administer insulin and is given handout on same.  Pt is to check fbs qam.  He is told to notify office for fbs < 70 or > 300 -pt to follow up with bs log 1 month.  RTO sooner prn

## 2016-03-16 ENCOUNTER — Telehealth: Payer: Self-pay | Admitting: Gastroenterology

## 2016-03-16 NOTE — Telephone Encounter (Signed)
Pt was returning a call to DS. I told him that she was off today. He said to call him back on Monday.

## 2016-03-20 ENCOUNTER — Other Ambulatory Visit: Payer: Self-pay | Admitting: Gastroenterology

## 2016-03-20 MED ORDER — LINACLOTIDE 290 MCG PO CAPS: 290.0000 ug | ORAL_CAPSULE | Freq: Every day | ORAL | 3 refills | Status: DC

## 2016-03-20 NOTE — Telephone Encounter (Signed)
Called, person unavailable.

## 2016-03-20 NOTE — Telephone Encounter (Signed)
See previous note

## 2016-03-21 NOTE — Telephone Encounter (Signed)
PT is unavailable. Mailing a letter.

## 2016-03-21 NOTE — Telephone Encounter (Signed)
Mailed letter for pt to call.

## 2016-03-26 ENCOUNTER — Telehealth: Payer: Self-pay

## 2016-03-26 NOTE — Telephone Encounter (Signed)
Pt said the Linzess 290 mcg is working too well. He takes one every 3 days and he has aobut 4 BM's the day that he takes it.  I spoke to Roseanne Kaufman, NP, who saw pt in the office. She said Ok to give him Linzess 145 mcg and I gave him #12 samples to take once daily 30 min before breakfast.

## 2016-03-26 NOTE — Telephone Encounter (Signed)
Pt is in the waiting room. He has missed a few calls from nurse and received a letter to contact DS. DS is aware patient is in the waiting room.

## 2016-04-02 ENCOUNTER — Encounter (HOSPITAL_COMMUNITY): Payer: Self-pay | Admitting: *Deleted

## 2016-04-02 ENCOUNTER — Ambulatory Visit (HOSPITAL_COMMUNITY)
Admission: RE | Admit: 2016-04-02 | Discharge: 2016-04-02 | Disposition: A | Discharge status: Home / Self Care | Payer: Self-pay | Source: Ambulatory Visit | Attending: Gastroenterology | Admitting: Gastroenterology

## 2016-04-02 ENCOUNTER — Encounter (HOSPITAL_COMMUNITY)
Admission: RE | Disposition: A | Discharge status: Home / Self Care | Payer: Self-pay | Source: Ambulatory Visit | Attending: Gastroenterology

## 2016-04-02 DIAGNOSIS — K5900 Constipation, unspecified: Secondary | ICD-10-CM | POA: Insufficient documentation

## 2016-04-02 DIAGNOSIS — E119 Type 2 diabetes mellitus without complications: Secondary | ICD-10-CM | POA: Insufficient documentation

## 2016-04-02 DIAGNOSIS — R634 Abnormal weight loss: Secondary | ICD-10-CM

## 2016-04-02 DIAGNOSIS — R1033 Periumbilical pain: Secondary | ICD-10-CM

## 2016-04-02 DIAGNOSIS — Z8249 Family history of ischemic heart disease and other diseases of the circulatory system: Secondary | ICD-10-CM | POA: Insufficient documentation

## 2016-04-02 DIAGNOSIS — R1030 Lower abdominal pain, unspecified: Secondary | ICD-10-CM | POA: Insufficient documentation

## 2016-04-02 DIAGNOSIS — F17210 Nicotine dependence, cigarettes, uncomplicated: Secondary | ICD-10-CM | POA: Insufficient documentation

## 2016-04-02 DIAGNOSIS — Z833 Family history of diabetes mellitus: Secondary | ICD-10-CM | POA: Insufficient documentation

## 2016-04-02 DIAGNOSIS — D122 Benign neoplasm of ascending colon: Secondary | ICD-10-CM

## 2016-04-02 DIAGNOSIS — I10 Essential (primary) hypertension: Secondary | ICD-10-CM | POA: Insufficient documentation

## 2016-04-02 DIAGNOSIS — K644 Residual hemorrhoidal skin tags: Secondary | ICD-10-CM | POA: Insufficient documentation

## 2016-04-02 DIAGNOSIS — F419 Anxiety disorder, unspecified: Secondary | ICD-10-CM | POA: Insufficient documentation

## 2016-04-02 DIAGNOSIS — Z79899 Other long term (current) drug therapy: Secondary | ICD-10-CM | POA: Insufficient documentation

## 2016-04-02 DIAGNOSIS — R1013 Epigastric pain: Secondary | ICD-10-CM

## 2016-04-02 DIAGNOSIS — R194 Change in bowel habit: Secondary | ICD-10-CM

## 2016-04-02 DIAGNOSIS — Q438 Other specified congenital malformations of intestine: Secondary | ICD-10-CM

## 2016-04-02 DIAGNOSIS — Z9889 Other specified postprocedural states: Secondary | ICD-10-CM | POA: Insufficient documentation

## 2016-04-02 DIAGNOSIS — K648 Other hemorrhoids: Secondary | ICD-10-CM | POA: Insufficient documentation

## 2016-04-02 DIAGNOSIS — K2970 Gastritis, unspecified, without bleeding: Secondary | ICD-10-CM | POA: Insufficient documentation

## 2016-04-02 DIAGNOSIS — Z794 Long term (current) use of insulin: Secondary | ICD-10-CM | POA: Insufficient documentation

## 2016-04-02 DIAGNOSIS — K219 Gastro-esophageal reflux disease without esophagitis: Secondary | ICD-10-CM | POA: Insufficient documentation

## 2016-04-02 DIAGNOSIS — Z800 Family history of malignant neoplasm of digestive organs: Secondary | ICD-10-CM | POA: Insufficient documentation

## 2016-04-02 DIAGNOSIS — Z1211 Encounter for screening for malignant neoplasm of colon: Secondary | ICD-10-CM

## 2016-04-02 DIAGNOSIS — F329 Major depressive disorder, single episode, unspecified: Secondary | ICD-10-CM | POA: Insufficient documentation

## 2016-04-02 DIAGNOSIS — B9681 Helicobacter pylori [H. pylori] as the cause of diseases classified elsewhere: Secondary | ICD-10-CM | POA: Insufficient documentation

## 2016-04-02 DIAGNOSIS — Z885 Allergy status to narcotic agent status: Secondary | ICD-10-CM | POA: Insufficient documentation

## 2016-04-02 DIAGNOSIS — Z8042 Family history of malignant neoplasm of prostate: Secondary | ICD-10-CM | POA: Insufficient documentation

## 2016-04-02 HISTORY — PX: ESOPHAGOGASTRODUODENOSCOPY: SHX5428

## 2016-04-02 HISTORY — PX: COLONOSCOPY: SHX5424

## 2016-04-02 LAB — GLUCOSE, CAPILLARY: Glucose-Capillary: 170 mg/dL — ABNORMAL HIGH (ref 65–99)

## 2016-04-02 SURGERY — COLONOSCOPY
Anesthesia: Moderate Sedation

## 2016-04-02 MED ORDER — SODIUM CHLORIDE 0.9% FLUSH
INTRAVENOUS | Status: AC
  Filled 2016-04-02: qty 10

## 2016-04-02 MED ORDER — PROMETHAZINE HCL 25 MG/ML IJ SOLN
25.0000 mg | Freq: Once | INTRAMUSCULAR | Status: AC
  Administered 2016-04-02: 25 mg via INTRAVENOUS

## 2016-04-02 MED ORDER — MEPERIDINE HCL 100 MG/ML IJ SOLN
INTRAMUSCULAR | Status: AC
  Filled 2016-04-02: qty 2

## 2016-04-02 MED ORDER — MIDAZOLAM HCL 5 MG/5ML IJ SOLN
INTRAMUSCULAR | Status: DC | PRN
  Administered 2016-04-02: 2 mg via INTRAVENOUS
  Administered 2016-04-02: 1 mg via INTRAVENOUS
  Administered 2016-04-02: 2 mg via INTRAVENOUS

## 2016-04-02 MED ORDER — MIDAZOLAM HCL 5 MG/5ML IJ SOLN
INTRAMUSCULAR | Status: AC
  Filled 2016-04-02: qty 10

## 2016-04-02 MED ORDER — LIDOCAINE VISCOUS 2 % MT SOLN
OROMUCOSAL | Status: AC
  Filled 2016-04-02: qty 15

## 2016-04-02 MED ORDER — SODIUM CHLORIDE 0.9 % IV SOLN
INTRAVENOUS | Status: DC
  Administered 2016-04-02: 10:00:00 via INTRAVENOUS

## 2016-04-02 MED ORDER — STERILE WATER FOR IRRIGATION IR SOLN
Status: DC | PRN
  Administered 2016-04-02: 100 mL

## 2016-04-02 MED ORDER — MEPERIDINE HCL 100 MG/ML IJ SOLN
INTRAMUSCULAR | Status: DC | PRN
  Administered 2016-04-02 (×2): 25 mg via INTRAVENOUS
  Administered 2016-04-02: 50 mg via INTRAVENOUS

## 2016-04-02 MED ORDER — PROMETHAZINE HCL 25 MG/ML IJ SOLN
INTRAMUSCULAR | Status: AC
  Filled 2016-04-02: qty 1

## 2016-04-02 MED ORDER — LINACLOTIDE 72 MCG PO CAPS: 72.0000 ug | ORAL_CAPSULE | Freq: Every day | ORAL | 11 refills | Status: DC

## 2016-04-02 NOTE — Op Note (Signed)
[facility] Patient Name: Jerry Cuevas Procedure Date: 04/02/2016 11:24 AM MRN: [id number] Date of Birth: 1955-04-19 Attending MD: Barney Drain , MD CSN: [id number] Age: 61 Admit Type: Outpatient Procedure:                Upper GI endoscopy with COLD FORCEPS BIOPSY Indications:              Periumbilical abdominal pain, Dyspepsia, Weight                            loss-PMHx: ANXIETY/DEPRESSION. Providers:                Barney Drain, MD, Janeece Riggers, RN, Rosina Lowenstein, RN Referring MD:             Soyla Dryer PA-C, PA Medicines:                TCS + Meperidine 25 mg IV, Midazolam 1 mg IV Complications:            No immediate complications. Estimated Blood Loss:     Estimated blood loss was minimal. Procedure:                Pre-Anesthesia Assessment:                           - Prior to the procedure, a History and Physical                            was performed, and patient medications and                            allergies were reviewed. The patient's tolerance of                            previous anesthesia was also reviewed. The risks                            and benefits of the procedure and the sedation                            options and risks were discussed with the patient.                            All questions were answered, and informed consent                            was obtained. Prior Anticoagulants: The patient has                            taken previous NSAID medication. ASA Grade                            Assessment: II - A patient with mild systemic                            disease. After reviewing the risks and benefits,  the patient was deemed in satisfactory condition to                            undergo the procedure. After obtaining informed                            consent, the endoscope was passed under direct                            vision. Throughout the procedure, the patient's           blood pressure, pulse, and oxygen saturations were                            monitored continuously. The [id number]                            scope was introduced through the mouth, and                            advanced to the second part of duodenum. The upper                            GI endoscopy was accomplished without difficulty.                            The patient tolerated the procedure well. Scope In: 11:28:39 AM Scope Out: 11:38:47 AM Scope Withdrawal Time: 0 hours 0 minutes 33 seconds  Total Procedure Duration: 0 hours 10 minutes 8 seconds  Findings:      The examined esophagus was normal.      Patchy mild inflammation characterized by congestion (edema) and       erythema was found in the gastric body and in the gastric antrum.       Biopsies were taken with a cold forceps for Helicobacter pylori testing.      The examined duodenum was normal. Biopsies for histology were taken with       a cold forceps for evaluation of celiac disease. Impression:               - Normal esophagus.                           - Gastritis. Biopsied.                           - NO SOURCE FOR WEIGHT LOSS IDENTIFIED. ABDOMINAL                            PAIN/CONSTIPATION MOST LIKELY DUE TO FUNCTIONAL                            CONSTIPATION EXACERBATED BY DIABETES Moderate Sedation:      Moderate (conscious) sedation was administered by the endoscopy nurse       and supervised by the endoscopist. The following parameters were       monitored: oxygen saturation, heart rate, blood pressure, and  response       to care. Total physician intraservice time was 57 minutes. Recommendation:           - Patient has a contact number available for                            emergencies. The signs and symptoms of potential                            delayed complications were discussed with the                            patient. Return to normal activities tomorrow.                             Written discharge instructions were provided to the                            patient.                           - High fiber diet.                           - Await pathology results.                           - Return to my office in 4 months.                           - DRINK WATER                           - Continue present medications. CONTINUE LINZESS. Procedure Code(s):        --- Professional ---                           [phone], Esophagogastroduodenoscopy, flexible,                            transoral; with biopsy, single or multiple                           99152, Moderate sedation services provided by the                            same physician or other qualified health care                            professional performing the diagnostic or                            therapeutic service that the sedation supports,                            requiring the presence of an independent trained  observer to assist in the monitoring of the                            patient's level of consciousness and physiological                            status; initial 15 minutes of intraservice time,                            patient age 61 years or older                           [phone], Moderate sedation services; each additional                            15 minutes intraservice time                           99153, Moderate sedation services; each additional                            15 minutes intraservice time                           99153, Moderate sedation services; each additional                            15 minutes intraservice time Diagnosis Code(s):        --- Professional ---                           K29.70, Gastritis, unspecified, without bleeding                           [id number], Periumbilical pain                           R10.13, Epigastric pain                           R63.4, Abnormal weight loss CPT copyright 2016 American Medical  Association. All rights reserved. The codes documented in this report are preliminary and upon coder review may  be revised to meet current compliance requirements. Barney Drain, MD Barney Drain, MD 04/02/2016 12:07:21 PM This report has been signed electronically. Number of Addenda: 0

## 2016-04-02 NOTE — Op Note (Signed)
[facility] Patient Name: Jerry Cuevas Procedure Date: 04/02/2016 10:25 AM MRN: [id number] Date of Birth: September 25, 1955 Attending MD: Barney Drain , MD CSN: [id number] Age: 61 Admit Type: Outpatient Procedure:                Colonoscopy WITH SNARE POLYPECTOMY Indications:              Lower abdominal pain, Change in bowel habits Providers:                Barney Drain, MD, Janeece Riggers, RN, Rosina Lowenstein, RN Referring MD:             Soyla Dryer PA-C, PA Medicines:                Meperidine 75 mg IV, Midazolam 4 mg IV Complications:            No immediate complications. Estimated Blood Loss:     Estimated blood loss: none. Procedure:                Pre-Anesthesia Assessment:                           - Prior to the procedure, a History and Physical                            was performed, and patient medications and                            allergies were reviewed. The patient's tolerance of                            previous anesthesia was also reviewed. The risks                            and benefits of the procedure and the sedation                            options and risks were discussed with the patient.                            All questions were answered, and informed consent                            was obtained. Prior Anticoagulants: The patient has                            taken previous NSAID medication. ASA Grade                            Assessment: II - A patient with mild systemic                            disease. After reviewing the risks and benefits,                            the patient was deemed in satisfactory condition to  undergo the procedure. After obtaining informed                            consent, the colonoscope was passed under direct                            vision. Throughout the procedure, the patient's                            blood pressure, pulse, and oxygen saturations were         monitored continuously. The [id number]                            scope was introduced through the anus and advanced                            to the 10 cm into the ileum. The colonoscopy was                            somewhat difficult due to a tortuous colon.                            Successful completion of the procedure was aided by                            COLOWRAP. The patient tolerated the procedure well.                            The quality of the bowel preparation was good. The                            terminal ileum, ileocecal valve, appendiceal                            orifice, and rectum were photographed. Scope In: 10:56:30 AM Scope Out: 11:18:46 AM Total Procedure Duration: 0 hours 22 minutes 16 seconds  Findings:      The terminal ileum appeared normal.      A 6 mm polyp was found in the distal ascending colon. The polyp was       sessile. The polyp was removed with a hot snare. Resection and retrieval       were complete.      The sigmoid colon and descending colon were mildly redundant.      External and internal hemorrhoids were found during retroflexion. The       hemorrhoids were moderate. Impression:               - The examined portion of the ileum was normal.                           - One 6 mm polyp in the distal ascending colon,                            removed with a hot snare. Resected and  retrieved.                           - Redundant colon.                           - External and internal hemorrhoids. Moderate Sedation:      Moderate (conscious) sedation was administered by the endoscopy nurse       and supervised by the endoscopist. The following parameters were       monitored: oxygen saturation, heart rate, blood pressure, and response       to care. Total physician intraservice time was 57 minutes. Recommendation:           - High fiber diet.                           - Continue present medications.                            - Await pathology results.                           - Repeat colonoscopy in 5-10 years for surveillance.                           - Return to GI office in 4 months.                           - Patient has a contact number available for                            emergencies. The signs and symptoms of potential                            delayed complications were discussed with the                            patient. Return to normal activities tomorrow.                            Written discharge instructions were provided to the                            patient. Procedure Code(s):        --- Professional ---                           [phone], Colonoscopy, flexible; with removal of                            tumor(s), polyp(s), or other lesion(s) by snare                            technique                           99152, Moderate sedation services provided by  the                            same physician or other qualified health care                            professional performing the diagnostic or                            therapeutic service that the sedation supports,                            requiring the presence of an independent trained                            observer to assist in the monitoring of the                            patient's level of consciousness and physiological                            status; initial 15 minutes of intraservice time,                            patient age 4 years or older                           [phone], Moderate sedation services; each additional                            15 minutes intraservice time                           99153, Moderate sedation services; each additional                            15 minutes intraservice time                           99153, Moderate sedation services; each additional                            15 minutes intraservice time Diagnosis Code(s):        --- Professional ---                            K64.8, Other hemorrhoids                           D12.2, Benign neoplasm of ascending colon                           R10.30, Lower abdominal pain, unspecified                           R19.4, Change in bowel habit  Q43.8, Other specified congenital malformations of                            intestine CPT copyright 2016 American Medical Association. All rights reserved. The codes documented in this report are preliminary and upon coder review may  be revised to meet current compliance requirements. Barney Drain, MD Barney Drain, MD 04/02/2016 11:59:30 AM This report has been signed electronically. Number of Addenda: 0

## 2016-04-02 NOTE — Discharge Instructions (Signed)
NO SOURCE FOR YOUR  ABDOMINAL PAIN/CONSTIPATION WAS IDENTIFIED. IF YOUR BOWELS DOD NOT MOVE YOUR STOMACH WILL HURT. You have internal hemorrhoids, and HAD 1 polyp removed. You have mild gastritis.I biopsied your stomach and small bowel.   DRINK WATER TO KEEP YOUR URINE LIGHT YELLOW.  FOLLOW A HIGH FIBER DIET. AVOID ITEMS THAT CAUSE BLOATING. SEE INFO BELOW.  YOUR BIOPSY RESULTS WILL BE AVAILABLE IN MY CHART AFTER MAR 7 AND MY OFFICE WILL CONTACT YOU IN 10-14 DAYS WITH YOUR RESULTS.   CONTINUE LINZESS. Take 30 minS prior to FIRST MEAL. Pickup sample #8 at [place]. PLEASE CALL IF YOU WOULD LIKE A PRESCRIPTION SENT TO YOUR PHARMACY.  FOLLOW UP IN 4 MOS.   Next colonoscopy in 5-10 years.   ENDOSCOPY Care After Read the instructions outlined below and refer to this sheet in the next week. These discharge instructions provide you with general information on caring for yourself after you leave the hospital. While your treatment has been planned according to the most current medical practices available, unavoidable complications occasionally occur. If you have any problems or questions after discharge, call DR. Tayona Sarnowski, [phone].  ACTIVITY  You may resume your regular activity, but move at a slower pace for the next 24 hours.   Take frequent rest periods for the next 24 hours.   Walking will help get rid of the air and reduce the bloated feeling in your belly (abdomen).   No driving for 24 hours (because of the medicine (anesthesia) used during the test).   You may shower.   Do not sign any important legal documents or operate any machinery for 24 hours (because of the anesthesia used during the test).    NUTRITION  Drink plenty of fluids.   You may resume your normal diet as instructed by your doctor.   Begin with a light meal and progress to your normal diet. Heavy or fried foods are harder to digest and may make you feel sick to your stomach (nauseated).   Avoid alcoholic  beverages for 24 hours or as instructed.    MEDICATIONS  You may resume your normal medications.   WHAT YOU CAN EXPECT TODAY  Some feelings of bloating in the abdomen.   Passage of more gas than usual.   Spotting of blood in your stool or on the toilet paper  .  IF YOU HAD POLYPS REMOVED DURING THE ENDOSCOPY:  Eat a soft diet IF YOU HAVE NAUSEA, BLOATING, ABDOMINAL PAIN, OR VOMITING.    FINDING OUT THE RESULTS OF YOUR TEST Not all test results are available during your visit. DR. Oneida Alar WILL CALL YOU WITHIN 7 DAYS OF YOUR PROCEDUE WITH YOUR RESULTS. Do not assume everything is normal if you have not heard from DR. Jeanice Dempsey IN ONE WEEK, CALL HER OFFICE AT [phone].  SEEK IMMEDIATE MEDICAL ATTENTION AND CALL THE OFFICE: [phone] IF:  You have more than a spotting of blood in your stool.   Your belly is swollen (abdominal distention).   You are nauseated or vomiting.   You have a temperature over 101F.   You have abdominal pain or discomfort that is severe or gets worse throughout the day.  Polyps, Colon  A polyp is extra tissue that grows inside your body. Colon polyps grow in the large intestine. The large intestine, also called the colon, is part of your digestive system. It is a long, hollow tube at the end of your digestive tract where your body makes and stores stool.  Most polyps are not dangerous. They are benign. This means they are not cancerous. But over time, some types of polyps can turn into cancer. Polyps that are smaller than a pea are usually not harmful. But larger polyps could someday become or may already be cancerous. To be safe, doctors remove all polyps and test them.   WHO GETS POLYPS? Anyone can get polyps, but certain people are more likely than others. You may have a greater chance of getting polyps if:  You are over 50.   You have had polyps before.   Someone in your family has had polyps.   Someone in your family has had cancer of the  large intestine.   Find out if someone in your family has had polyps. You may also be more likely to get polyps if you:   Eat a lot of fatty foods   Smoke   Drink alcohol   Do not exercise  Eat too much   PREVENTION There is not one sure way to prevent polyps. You might be able to lower your risk of getting them if you:  Eat more fruits and vegetables and less fatty food.   Do not smoke.   Avoid alcohol.   Exercise every day.   Lose weight if you are overweight.   Eating more calcium and folate can also lower your risk of getting polyps. Some foods that are rich in calcium are milk, cheese, and broccoli. Some foods that are rich in folate are chickpeas, kidney beans, and spinach.    Gastritis  Gastritis is an inflammation (the body's way of reacting to injury and/or infection) of the stomach. It is often caused by viral or bacterial (germ) infections. It can also be caused BY ASPIRIN, BC/GOODY POWDER'S, (IBUPROFEN) MOTRIN, OR ALEVE (NAPROXEN), chemicals (including alcohol), SPICY FOODS, and medications. This illness may be associated with generalized malaise (feeling tired, not well), UPPER ABDOMINAL STOMACH cramps, and fever. One common bacterial cause of gastritis is an organism known as H. Pylori. This can be treated with antibiotics.    High-Fiber Diet A high-fiber diet changes your normal diet to include more whole grains, legumes, fruits, and vegetables. Changes in the diet involve replacing refined carbohydrates with unrefined foods. The calorie level of the diet is essentially unchanged. The Dietary Reference Intake (recommended amount) for adult males is 38 grams per day. For adult females, it is 25 grams per day. Pregnant and lactating women should consume 28 grams of fiber per day. Fiber is the intact part of a plant that is not broken down during digestion. Functional fiber is fiber that has been isolated from the plant to provide a beneficial effect in the  body. PURPOSE  Increase stool bulk.   Ease and regulate bowel movements.   Lower cholesterol.  INDICATIONS THAT YOU NEED MORE FIBER  Constipation and hemorrhoids.   Uncomplicated diverticulosis (intestine condition) and irritable bowel syndrome.   Weight management.   As a protective measure against hardening of the arteries (atherosclerosis), diabetes, and cancer.   GUIDELINES FOR INCREASING FIBER IN THE DIET  Start adding fiber to the diet slowly. A gradual increase of about 5 more grams (2 slices of whole-wheat bread, 2 servings of most fruits or vegetables, or 1 bowl of high-fiber cereal) per day is best. Too rapid an increase in fiber may result in constipation, flatulence, and bloating.   Drink enough water and fluids to keep your urine clear or pale yellow. Water, juice, or caffeine-free drinks are  recommended. Not drinking enough fluid may cause constipation.   Eat a variety of high-fiber foods rather than one type of fiber.   Try to increase your intake of fiber through using high-fiber foods rather than fiber pills or supplements that contain small amounts of fiber.   The goal is to change the types of food eaten. Do not supplement your present diet with high-fiber foods, but replace foods in your present diet.  INCLUDE A VARIETY OF FIBER SOURCES  Replace refined and processed grains with whole grains, canned fruits with fresh fruits, and incorporate other fiber sources. White rice, white breads, and most bakery goods contain little or no fiber.   Brown whole-grain rice, buckwheat oats, and many fruits and vegetables are all good sources of fiber. These include: broccoli, Brussels sprouts, cabbage, cauliflower, beets, sweet potatoes, white potatoes (skin on), carrots, tomatoes, eggplant, squash, berries, fresh fruits, and dried fruits.   Cereals appear to be the richest source of fiber. Cereal fiber is found in whole grains and bran. Bran is the fiber-rich outer coat of  cereal grain, which is largely removed in refining. In whole-grain cereals, the bran remains. In breakfast cereals, the largest amount of fiber is found in those with "bran" in their names. The fiber content is sometimes indicated on the label.   You may need to include additional fruits and vegetables each day.   In baking, for 1 cup white flour, you may use the following substitutions:   1 cup whole-wheat flour minus 2 tablespoons.   1/2 cup white flour plus 1/2 cup whole-wheat flour.   Low-Fat Diet BREADS, CEREALS, PASTA, RICE, DRIED PEAS, AND BEANS These products are high in carbohydrates and most are low in fat. Therefore, they can be increased in the diet as substitutes for fatty foods. They too, however, contain calories and should not be eaten in excess. Cereals can be eaten for snacks as well as for breakfast.  Include foods that contain fiber (fruits, vegetables, whole grains, and legumes). Research shows that fiber may lower blood cholesterol levels, especially the water-soluble fiber found in fruits, vegetables, oat products, and legumes. FRUITS AND VEGETABLES It is good to eat fruits and vegetables. Besides being sources of fiber, both are rich in vitamins and some minerals. They help you get the daily allowances of these nutrients. Fruits and vegetables can be used for snacks and desserts. MEATS Limit lean meat, chicken, [place], and fish to no more than 6 ounces per day. Beef, Pork, and Lamb Use lean cuts of beef, pork, and lamb. Lean cuts include:  Extra-lean ground beef.  Arm roast.  Sirloin tip.  Center-cut ham.  Round steak.  Loin chops.  Rump roast.  Tenderloin.  Trim all fat off the outside of meats before cooking. It is not necessary to severely decrease the intake of red meat, but lean choices should be made. Lean meat is rich in protein and contains a highly absorbable form of iron. Premenopausal women, in particular, should avoid reducing lean red meat because this  could increase the risk for low red blood cells (iron-deficiency anemia). The organ meats, such as liver, sweetbreads, kidneys, and brain are very rich in cholesterol. They should be limited. Chicken and [place] These are good sources of protein. The fat of poultry can be reduced by removing the skin and underlying fat layers before cooking. Chicken and [place] can be substituted for lean red meat in the diet. Poultry should not be fried or covered with high-fat sauces. Fish  and Shellfish Fish is a good source of protein. Shellfish contain cholesterol, but they usually are low in saturated fatty acids. The preparation of fish is important. Like chicken and [place], they should not be fried or covered with high-fat sauces. EGGS Egg whites contain no fat or cholesterol. They can be eaten often. Try 1 to 2 egg whites instead of whole eggs in recipes or use egg substitutes that do not contain yolk. MILK AND DAIRY PRODUCTS Use skim or 1% milk instead of 2% or whole milk. Decrease whole milk, natural, and processed cheeses. Use nonfat or low-fat (2%) cottage cheese or low-fat cheeses made from vegetable oils. Choose nonfat or low-fat (1 to 2%) yogurt. Experiment with evaporated skim milk in recipes that call for heavy cream. Substitute low-fat yogurt or low-fat cottage cheese for sour cream in dips and salad dressings. Have at least 2 servings of low-fat dairy products, such as 2 glasses of skim (or 1%) milk each day to help get your daily calcium intake.  FATS AND OILS Reduce the total intake of fats, especially saturated fat. Butterfat, lard, and beef fats are high in saturated fat and cholesterol. These should be avoided as much as possible. Vegetable fats do not contain cholesterol, but certain vegetable fats, such as coconut oil, palm oil, and palm kernel oil are very high in saturated fats. These should be limited. These fats are often used in bakery goods, processed foods, popcorn, oils, and nondairy  creamers. Vegetable shortenings and some peanut butters contain hydrogenated oils, which are also saturated fats. Read the labels on these foods and check for saturated vegetable oils. Unsaturated vegetable oils and fats do not raise blood cholesterol. However, they should be limited because they are fats and are high in calories. Total fat should still be limited to 30% of your daily caloric intake. Desirable liquid vegetable oils are corn oil, cottonseed oil, olive oil, canola oil, safflower oil, soybean oil, and sunflower oil. Peanut oil is not as good, but small amounts are acceptable. Buy a heart-healthy tub margarine that has no partially hydrogenated oils in the ingredients. Mayonnaise and salad dressings often are made from unsaturated fats, but they should also be limited because of their high calorie and fat content. Seeds, nuts, peanut butter, olives, and avocados are high in fat, but the fat is mainly the unsaturated type. These foods should be limited mainly to avoid excess calories and fat. OTHER EATING TIPS Snacks  Most sweets should be limited as snacks. They tend to be rich in calories and fats, and their caloric content outweighs their nutritional value. Some good choices in snacks are graham crackers, melba toast, soda crackers, bagels (no egg), English muffins, fruits, and vegetables. These snacks are preferable to snack crackers, [place] fries, and chips. Popcorn should be air-popped or cooked in small amounts of liquid vegetable oil. Desserts Eat fruit, low-fat yogurt, and fruit ices. AVOID pastries, cake, and cookies. Sherbet, angel food cake, gelatin dessert, frozen low-fat yogurt, or other frozen products that do not contain saturated fat (pure fruit juice bars, frozen ice pops) are also acceptable.  COOKING METHODS Choose those methods that use little or no fat. They include: Poaching.  Braising.  Steaming.  Grilling.  Baking.  Stir-frying.  Broiling.  Microwaving.  Foods  can be cooked in a nonstick pan without added fat, or use a nonfat cooking spray in regular cookware. Limit fried foods and avoid frying in saturated fat. Add moisture to lean meats by using water, broth, cooking  wines, and other nonfat or low-fat sauces along with the cooking methods mentioned above. Soups and stews should be chilled after cooking. The fat that forms on top after a few hours in the refrigerator should be skimmed off. When preparing meals, avoid using excess salt. Salt can contribute to raising blood pressure in some people. EATING AWAY FROM HOME Order entres, potatoes, and vegetables without sauces or butter. When meat exceeds the size of a deck of cards (3 to 4 ounces), the rest can be taken home for another meal. Choose vegetable or fruit salads and ask for low-calorie salad dressings to be served on the side. Use dressings sparingly. Limit high-fat toppings, such as bacon, crumbled eggs, cheese, sunflower seeds, and olives. Ask for heart-healthy tub margarine instead of butter.   Hemorrhoids Hemorrhoids are dilated (enlarged) veins around the rectum. Sometimes clots will form in the veins. This makes them swollen and painful. These are called thrombosed hemorrhoids. Causes of hemorrhoids include:  Constipation.   Straining to have a bowel movement.   HEAVY LIFTING HOME CARE INSTRUCTIONS  Eat a well balanced diet and drink 6 to 8 glasses of water every day to avoid constipation. You may also use a bulk laxative.   Avoid straining to have bowel movements.   Keep anal area dry and clean.   Do not use a donut shaped pillow or sit on the toilet for long periods. This increases blood pooling and pain.   Move your bowels when your body has the urge; this will require less straining and will decrease pain and pressure.

## 2016-04-02 NOTE — H&P (Signed)
Primary Care Physician:  Soyla Dryer, PA-C Primary Gastroenterologist:  Dr. Oneida Alar  Pre-Procedure History & Physical: HPI:  Jerry Cuevas is a 61 y.o. male here for DYSPEPSIA/weight loss/changE in bowel habits: CONSTIPATION.  Past Medical History:  Diagnosis Date  . Anxiety   . Depression   . Diabetes mellitus without complication ([place]) [id number]  . GERD (gastroesophageal reflux disease)   . Hypertension 2007  . Metabolic syndrome     Past Surgical History:  Procedure Laterality Date  . TONSILLECTOMY    . TYMPANOPLASTY Right age 59    Prior to Admission medications   Medication Sig Start Date End Date Taking? Authorizing Provider  atorvastatin (LIPITOR) 10 MG tablet Take 1 tablet (10 mg total) by mouth daily. 01/03/16  Yes Soyla Dryer, PA-C  insulin glargine (LANTUS) 100 UNIT/ML injection Inject 10 Units into the skin at bedtime.   Yes Historical Provider, MD  lisinopril (PRINIVIL,ZESTRIL) 20 MG tablet Take 1 tablet (20 mg total) by mouth daily. 01/03/16  Yes Soyla Dryer, PA-C  metFORMIN (GLUCOPHAGE) 1000 MG tablet TAKE ONE TABLET BY MOUTH TWICE DAILY WITH  A  MEAL 02/09/16  Yes Soyla Dryer, PA-C  pantoprazole (PROTONIX) 40 MG tablet Take 1 tablet (40 mg total) by mouth daily. Take 30 minutes before breakfast. 03/07/16  Yes Annitta Needs, NP  sitaGLIPtin (JANUVIA) 100 MG tablet Take 1 tablet (100 mg total) by mouth daily. 01/03/16  Yes Soyla Dryer, PA-C  linaclotide (LINZESS) 290 MCG CAPS capsule Take 1 capsule (290 mcg total) by mouth daily before breakfast. Patient not taking: Reported on 03/27/2016 03/20/16   Annitta Needs, NP    Allergies as of 03/07/2016 - Review Complete 03/07/2016  Allergen Reaction Noted  . Morphine and related Nausea And Vomiting and Nausea Only 11/30/2015    Family History  Problem Relation Age of Onset  . Diabetes Father 6  . Heart disease Father   . Heart failure Father   . Prostate cancer Father   . Cancer Maternal Grandfather   .  Colon cancer Neg Hx     no first degree relatives with colon cancer. Maternal uncle had colon cancer     Social History   Social History  . Marital status: Divorced    Spouse name: N/A  . Number of children: N/A  . Years of education: N/A   Occupational History  . Unemployed    Social History Main Topics  . Smoking status: Current Every Day Smoker    Packs/day: 1.00    Years: 35.00    Types: Cigarettes  . Smokeless tobacco: Never Used     Comment: has bought lozenges to quit  . Alcohol use No  . Drug use: No  . Sexual activity: Not on file   Other Topics Concern  . Not on file   Social History Narrative  . No narrative on file    Review of Systems: See HPI, otherwise negative ROS   Physical Exam: BP (!) 158/85   Pulse (!) 113   Temp 98.1 F (36.7 C) (Oral)   Resp 16   Ht 5\' 10"  (1.778 m)   Wt 181 lb (82.1 kg)   SpO2 100%   BMI 25.97 kg/m  General:   Alert,  pleasant and cooperative in NAD Head:  Normocephalic and atraumatic. Neck:  Supple; Lungs:  Clear throughout to auscultation.    Heart:  Regular rate and rhythm. Abdomen:  Soft, nontender and nondistended. Normal bowel sounds, without guarding, and without rebound.  Neurologic:  Alert and  oriented x4;  grossly normal neurologically.  Impression/Plan:     DYSPEPSIA/weight loss/changE in bowel habits  PLAN: 1. TCS/?EGD TODAY. DISCUSSED PROCEDURE, BENEFITS, & RISKS: < 1% chance of medication reaction, bleeding, perforation, or rupture of spleen/liver.

## 2016-04-05 ENCOUNTER — Encounter (HOSPITAL_COMMUNITY): Payer: Self-pay | Admitting: Gastroenterology

## 2016-04-11 ENCOUNTER — Telehealth: Payer: Self-pay | Admitting: Gastroenterology

## 2016-04-11 NOTE — Telephone Encounter (Signed)
Pt was calling to check on his colonoscopy results and also said that the [place] wasn't helping. He said he was taking the low dose and maybe it should be increased to 161mcg instead. Please advise and call [phone]

## 2016-04-12 ENCOUNTER — Ambulatory Visit: Payer: Self-pay | Admitting: Physician Assistant

## 2016-04-12 MED ORDER — AMOXICILL-CLARITHRO-LANSOPRAZ PO MISC: Freq: Two times a day (BID) | ORAL | 0 refills | Status: AC

## 2016-04-12 NOTE — Telephone Encounter (Signed)
PT said he has also taken Linzess 72 mcg. He is taking the Linzess 145 mcg and has a BM. But he also has a lot of cramping, bloating and burping, which he had before he started the medication. Sending note to Roseanne Kaufman, NP to advise!

## 2016-04-12 NOTE — Telephone Encounter (Signed)
In reviewing chart, pt started out taking Linzess 290 mcg, that was too much and we gave him samples of 145 mcg. I have called and LMOM for a return call to discuss.

## 2016-04-12 NOTE — Telephone Encounter (Signed)
He had a tubular adenoma. Surveillance recommendations per Dr. Oneida Alar.  He has H.pylori gastritis. Will need treatment. For this,I have sent in Prevpac to take. Stop Protonix while on Prevpac, because it includes prevacid with it. DO NOT TAKE LIPITOR WHILE ON PREVPAC. HOLD LIPITOR WHILE TAKING.  Continue Linzess 145 for now. Treat H.pylori. Have him follow-up in office in next 4 weeks and see how he is doing. Could also trial Amitiza 24 mcg if needed instead of Linzess. Needs treatment of H.pylori first to see where we stand with everything.

## 2016-04-12 NOTE — Addendum Note (Signed)
Addended by: Annitta Needs on: 04/12/2016 01:26 PM   Modules accepted: Orders

## 2016-04-12 NOTE — Telephone Encounter (Signed)
PT is aware. Routing to [place] to schedule follow up appt.

## 2016-04-12 NOTE — Telephone Encounter (Signed)
See phone note

## 2016-04-13 ENCOUNTER — Telehealth: Payer: Self-pay

## 2016-04-13 ENCOUNTER — Encounter: Payer: Self-pay | Admitting: Gastroenterology

## 2016-04-13 NOTE — Telephone Encounter (Signed)
Yes. Needs to make sure taking PPI BID along with this.

## 2016-04-13 NOTE — Telephone Encounter (Signed)
Pt cannot afford Prepak at over $500.00. Is is Ok to tell pharmacist to fill individually?

## 2016-04-13 NOTE — Telephone Encounter (Signed)
appt made and letter sent  °

## 2016-04-13 NOTE — Telephone Encounter (Signed)
I called pharmacist Junie Panning) and she will fill separately, Amoxicillin, Clarithromycin,and Lansoprazole. Pt aware not to take the Protonix and Lipitor while taking these meds.

## 2016-04-19 ENCOUNTER — Telehealth: Payer: Self-pay | Admitting: Gastroenterology

## 2016-04-19 MED ORDER — LINACLOTIDE 145 MCG PO CAPS: 145.0000 ug | ORAL_CAPSULE | Freq: Every day | ORAL | 11 refills | Status: AC

## 2016-04-19 NOTE — Addendum Note (Signed)
Addended by: Mahala Menghini on: 04/19/2016 05:18 PM   Modules accepted: Orders

## 2016-04-19 NOTE — Telephone Encounter (Signed)
rx done

## 2016-04-19 NOTE — Telephone Encounter (Signed)
Patient called and stated that the linzess 145 worked and he would like a prescription called into [facility] in [place]   [phone]

## 2016-04-19 NOTE — Telephone Encounter (Signed)
Forwarding to Refill Box.

## 2016-04-20 NOTE — Telephone Encounter (Signed)
LMOM Rx was sent in.

## 2016-04-24 ENCOUNTER — Encounter: Payer: Self-pay | Admitting: Physician Assistant

## 2016-04-30 ENCOUNTER — Encounter: Payer: Self-pay | Admitting: Student

## 2016-04-30 NOTE — Telephone Encounter (Addendum)
PLEASE CALL PT. COMPLETE ABX FOR H PYLORI. HE HAD ONE SIMPLE ADENOMA REMOVED. NEXT COLONOSCOPY IN 5-10 YEARS. OPV JUL 2018 E30 CONSTIPATION/H PYLORI GASTRITIS.

## 2016-04-30 NOTE — Telephone Encounter (Signed)
Tried to call and could not get the call to go through.

## 2016-05-01 ENCOUNTER — Encounter: Payer: Self-pay | Admitting: Gastroenterology

## 2016-05-01 NOTE — Telephone Encounter (Signed)
APPT MADE AND ON RECALL  °

## 2016-05-01 NOTE — Telephone Encounter (Signed)
Tried to call, call would not go through.

## 2016-05-01 NOTE — Telephone Encounter (Signed)
Mailed a letter with results.

## 2016-05-14 ENCOUNTER — Telehealth: Payer: Self-pay

## 2016-05-14 NOTE — Telephone Encounter (Signed)
Results letter returned from [address], [place].  Need to read the results note to pt and also get an updated address. LMOM for a return call.

## 2016-05-16 ENCOUNTER — Ambulatory Visit: Payer: Self-pay | Admitting: Gastroenterology

## 2016-05-17 NOTE — Telephone Encounter (Signed)
Pt called and was informed of his results and updated his new address in epic.

## 2016-07-30 ENCOUNTER — Telehealth: Payer: Self-pay

## 2016-07-30 NOTE — Telephone Encounter (Signed)
Pt called- he said he has moved out of the area and he will not be able to come back here to be seen. Pt has ov scheduled for tomorrow. Please cancel.

## 2016-07-30 NOTE — Telephone Encounter (Signed)
Cancelled office visit.

## 2016-07-31 ENCOUNTER — Ambulatory Visit: Payer: Self-pay | Admitting: Gastroenterology

## 2016-08-02 ENCOUNTER — Ambulatory Visit: Payer: Self-pay | Admitting: Gastroenterology

## 2018-12-09 IMAGING — CT CT ABD-PELV W/ CM
2 of 5 series · 17 of 46 positions shown, 19 images · IV contrast (Isovue)
Comparison: None.

CLINICAL DATA: 60-year-old male with right-sided abdominal and
pelvic pain for several months.

EXAM:
CT ABDOMEN AND PELVIS WITH CONTRAST
TECHNIQUE: Multidetector CT imaging of the abdomen and pelvis was performed
using the standard protocol following bolus administration of
intravenous contrast.
CONTRAST:  100mL [id number] IOPAMIDOL ([id number]) INJECTION 61%

[Series 2: axial st · axial · 0.71mm/px · z∈[-407,+8]mm · 14 of 95 slices shown, 16 images]
[im 6/95  soft-tissue]
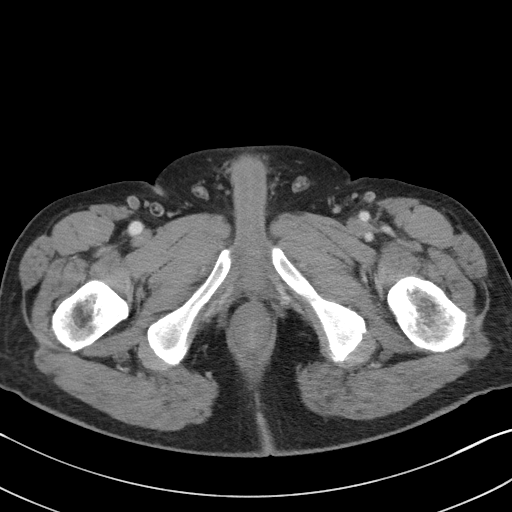
[im 6/95  bone]
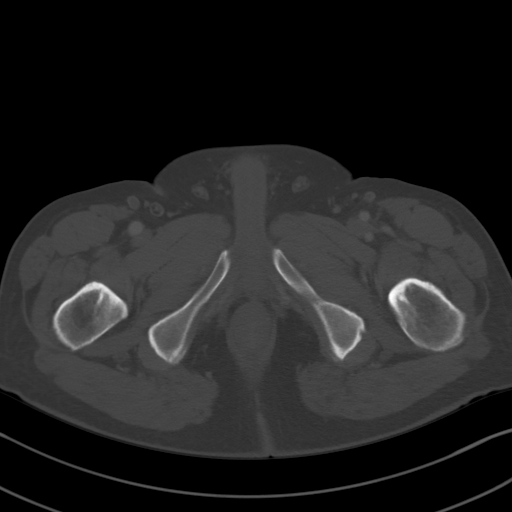
[im 12/95  soft-tissue]
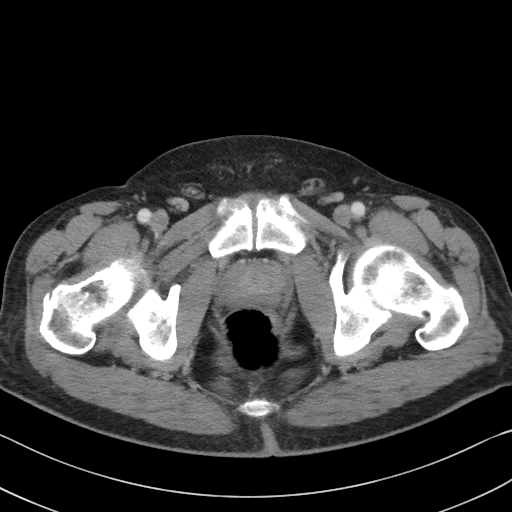
[im 17/95  soft-tissue]
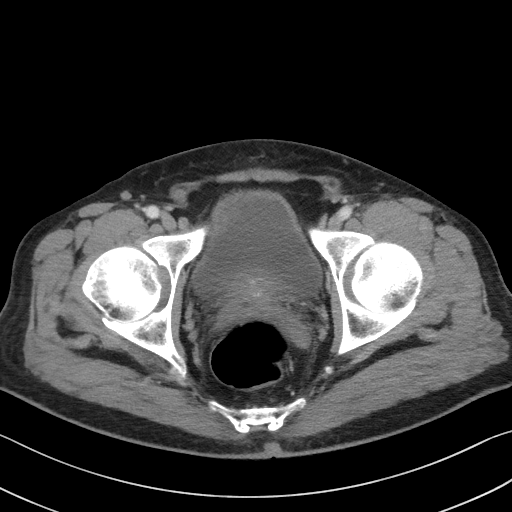
[im 28/95  soft-tissue]
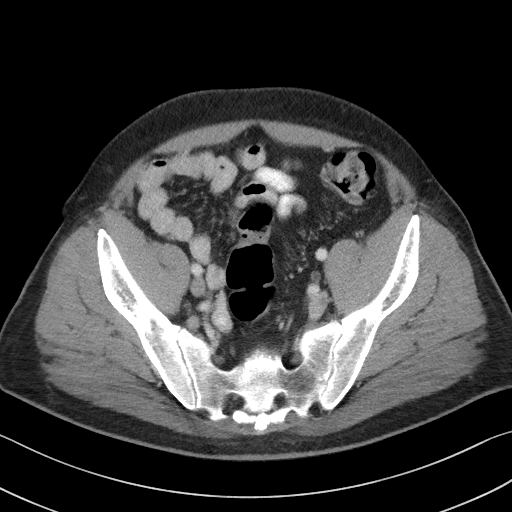
[im 34/95  soft-tissue]
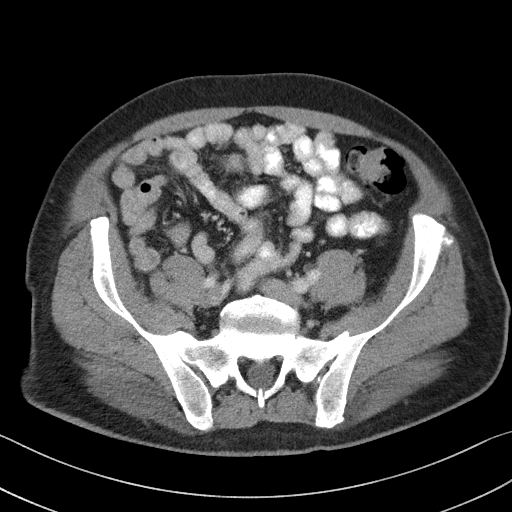
[im 39/95  soft-tissue]
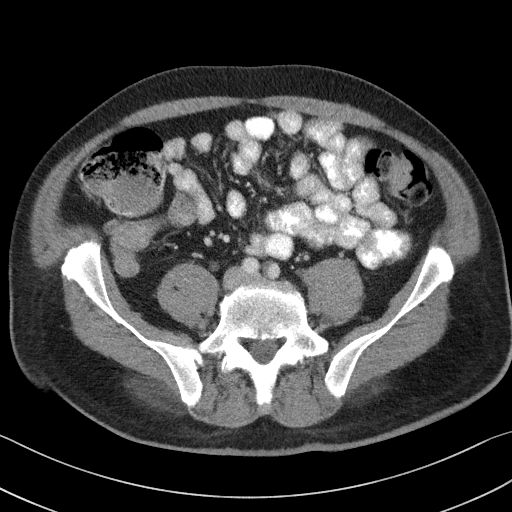
[im 45/95  soft-tissue]
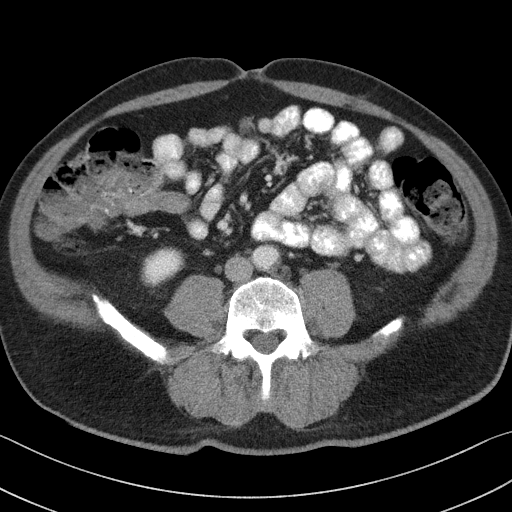
[im 50/95  soft-tissue]
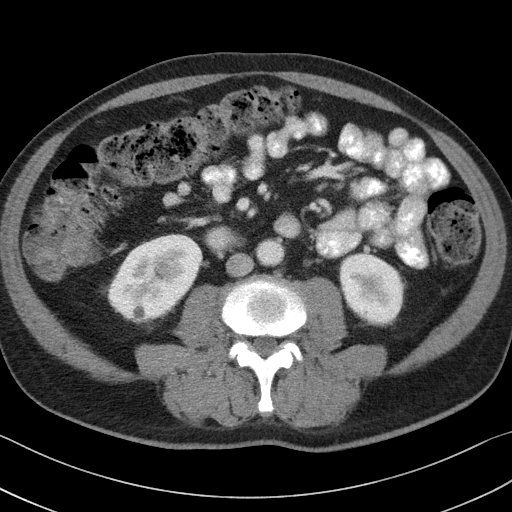
[im 56/95  soft-tissue]
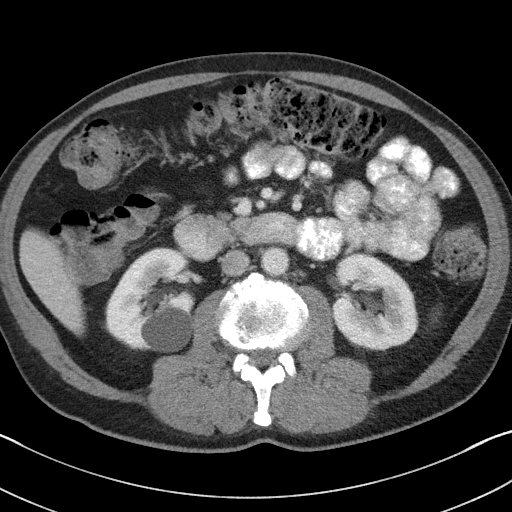
[im 56/95  bone]
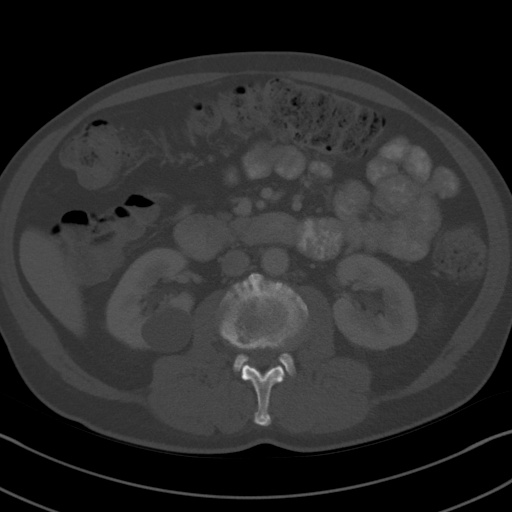
[im 61/95  soft-tissue]
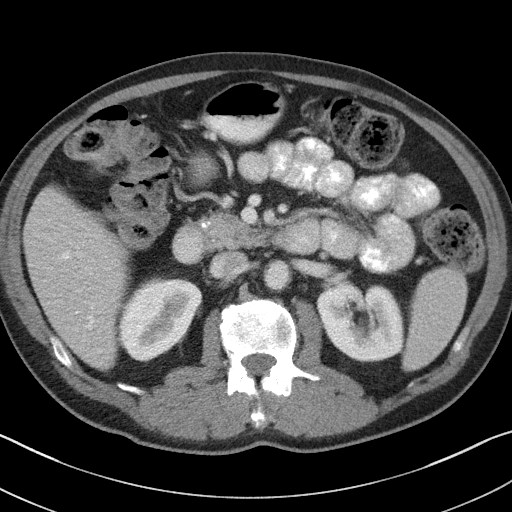
[im 72/95  soft-tissue]
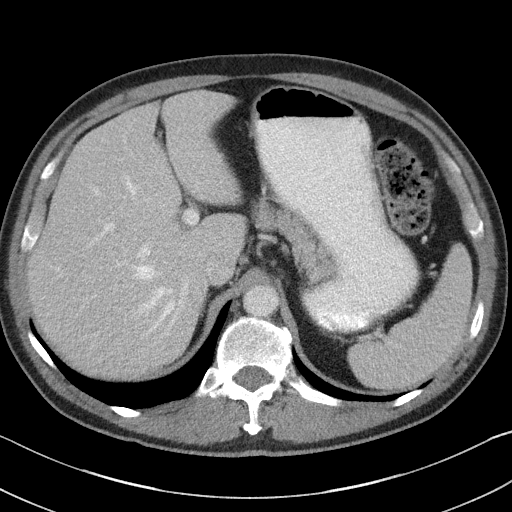
[im 78/95  soft-tissue]
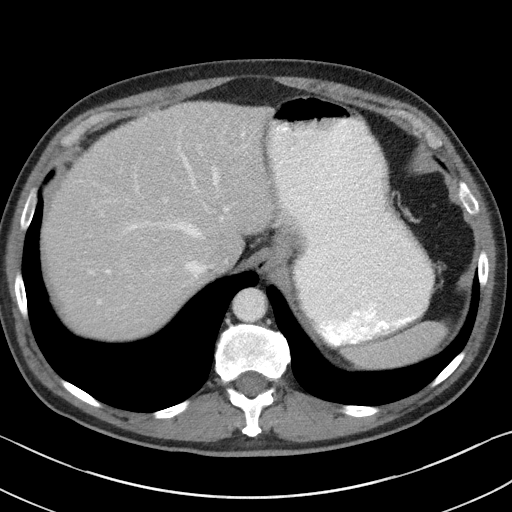
[im 83/95  soft-tissue]
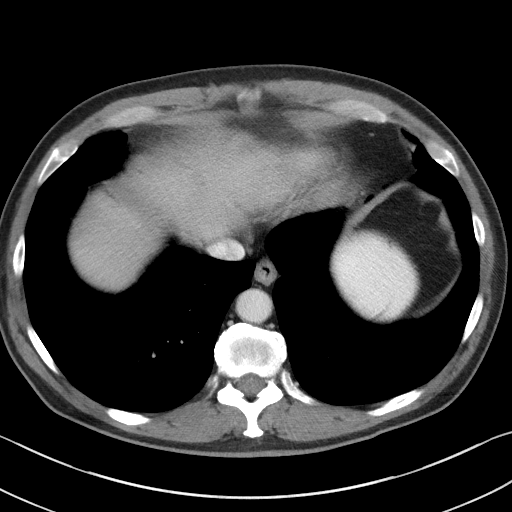
[im 89/95  soft-tissue]
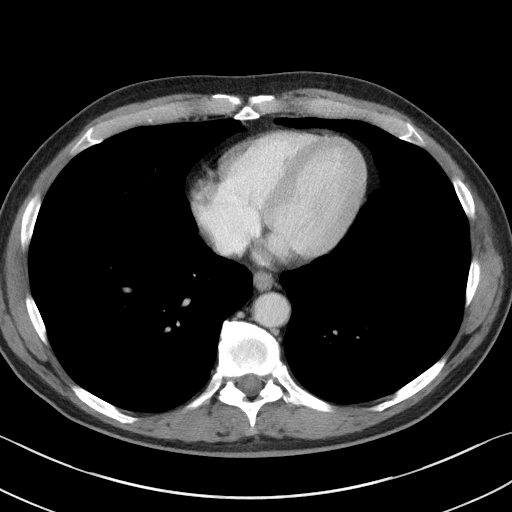

[Series 5: coronal st · coronal · 0.83mm/px · 3 of 116 slices shown]
[im 39/116  soft-tissue]
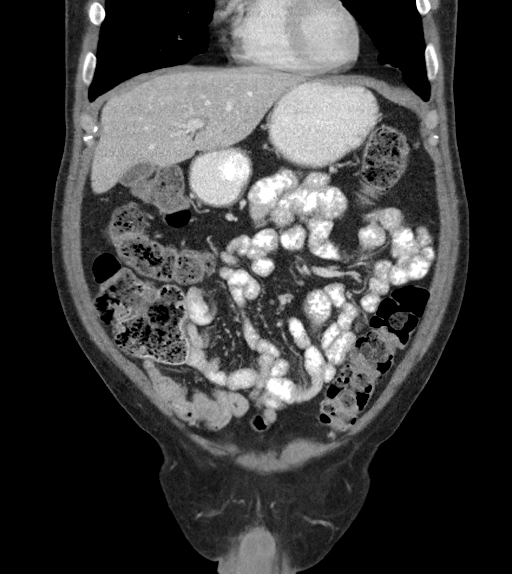
[im 52/116  soft-tissue]
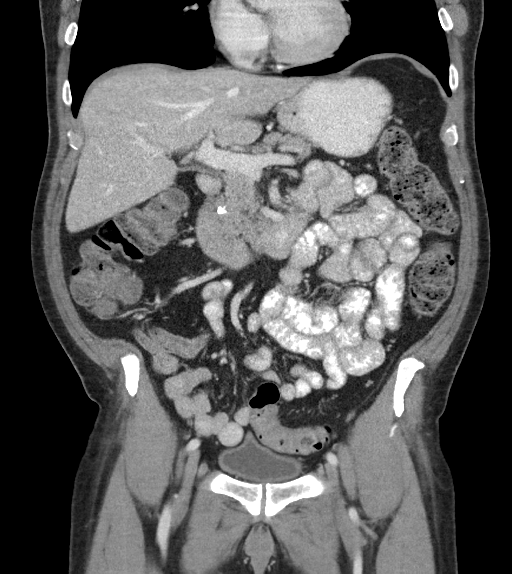
[im 64/116  soft-tissue]
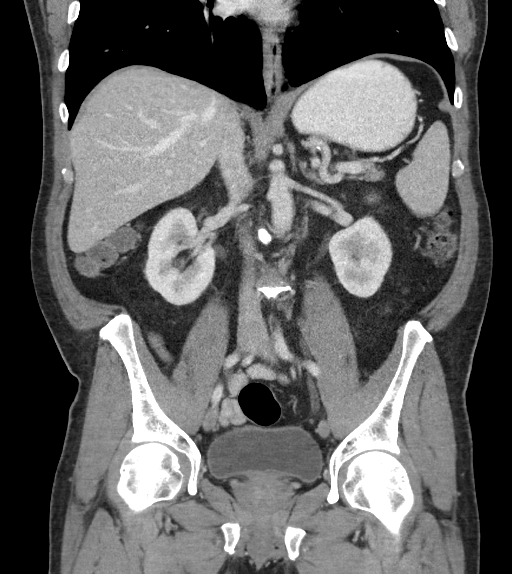

[17 of 46 positions shown; findings below may reference images not displayed]

FINDINGS: Lower chest: Unremarkable

Hepatobiliary: The liver and gallbladder are unremarkable. There is
no evidence of biliary dilatation.

Pancreas: Unremarkable

Spleen: Unremarkable

Adrenals/Urinary Tract: The kidneys, adrenal glands and bladder are
unremarkable except for right renal cysts.

Stomach/Bowel: Stomach is within normal limits. Appendix appears
normal. No evidence of bowel wall thickening, distention, or
inflammatory changes.

Vascular/Lymphatic: No significant vascular findings are present. No
enlarged abdominal or pelvic lymph nodes.

Reproductive: Prostate enlargement noted.

Other: No abdominal wall hernia or abnormality. No abdominopelvic
ascites.

Musculoskeletal: Moderate degenerative disc disease with endplate
changes at L2-3 noted.
IMPRESSION: No evidence of acute abnormality.

Prostate enlargement.

Moderate degenerative changes at L2-3.

## 2021-04-25 ENCOUNTER — Encounter: Payer: Self-pay | Admitting: *Deleted
